# Patient Record
Sex: Female | Born: 1935 | Race: White | Hispanic: No | Smoking: Former smoker
Health system: Southern US, Community
[De-identification: ages and names within clinical notes are randomized; demographics above are authoritative.]

## PROBLEM LIST (undated history)

## (undated) DIAGNOSIS — R630 Anorexia: Secondary | ICD-10-CM

## (undated) DIAGNOSIS — M179 Osteoarthritis of knee, unspecified: Secondary | ICD-10-CM

## (undated) DIAGNOSIS — Z8543 Personal history of malignant neoplasm of ovary: Secondary | ICD-10-CM

## (undated) DIAGNOSIS — R42 Dizziness and giddiness: Secondary | ICD-10-CM

## (undated) DIAGNOSIS — M1710 Unilateral primary osteoarthritis, unspecified knee: Secondary | ICD-10-CM

## (undated) DIAGNOSIS — R002 Palpitations: Secondary | ICD-10-CM

## (undated) DIAGNOSIS — E7800 Pure hypercholesterolemia, unspecified: Secondary | ICD-10-CM

## (undated) DIAGNOSIS — M159 Polyosteoarthritis, unspecified: Secondary | ICD-10-CM

## (undated) DIAGNOSIS — R221 Localized swelling, mass and lump, neck: Secondary | ICD-10-CM

## (undated) DIAGNOSIS — M62830 Muscle spasm of back: Secondary | ICD-10-CM

## (undated) DIAGNOSIS — N390 Urinary tract infection, site not specified: Secondary | ICD-10-CM

## (undated) DIAGNOSIS — R234 Changes in skin texture: Secondary | ICD-10-CM

## (undated) DIAGNOSIS — H9190 Unspecified hearing loss, unspecified ear: Secondary | ICD-10-CM

## (undated) HISTORY — DX: Unilateral primary osteoarthritis, unspecified knee: M17.10

## (undated) HISTORY — DX: Pure hypercholesterolemia, unspecified: E78.00

## (undated) HISTORY — DX: Muscle spasm of back: M62.830

## (undated) HISTORY — DX: Personal history of malignant neoplasm of ovary: Z85.43

## (undated) HISTORY — PX: PTERYGIUM EXCISION: SHX2273

## (undated) HISTORY — DX: Anorexia: R63.0

## (undated) HISTORY — DX: Palpitations: R00.2

## (undated) HISTORY — DX: Dizziness and giddiness: R42

## (undated) HISTORY — DX: Unspecified hearing loss, unspecified ear: H91.90

## (undated) HISTORY — DX: Urinary tract infection, site not specified: N39.0

## (undated) HISTORY — PX: ABDOMINAL HYSTERECTOMY: SHX81

## (undated) HISTORY — DX: Polyosteoarthritis, unspecified: M15.9

## (undated) HISTORY — DX: Localized swelling, mass and lump, neck: R22.1

## (undated) HISTORY — DX: Changes in skin texture: R23.4

## (undated) HISTORY — PX: LIFT / REPAIR BROW PTOSIS FOREHEAD: SUR827

## (undated) HISTORY — DX: Osteoarthritis of knee, unspecified: M17.9

---

## 2015-01-19 LAB — CBC AND DIFFERENTIAL
HCT: 50 % — AB (ref 36–46)
Hemoglobin: 15.8 g/dL (ref 12.0–16.0)
PLATELETS: 771 10*3/uL — AB (ref 150–399)
WBC: 14.6 10^3/mL

## 2015-02-20 LAB — CBC AND DIFFERENTIAL
HEMATOCRIT: 50 % — AB (ref 36–46)
Hemoglobin: 16.1 g/dL — AB (ref 12.0–16.0)
Neutrophils Absolute: 15 /uL
Platelets: 561 10*3/uL — AB (ref 150–399)
WBC: 17.3 10^3/mL

## 2015-02-20 LAB — BASIC METABOLIC PANEL
BUN: 16 mg/dL (ref 4–21)
Creatinine: 0.6 mg/dL (ref <=1.1)
GLUCOSE: 90 mg/dL
POTASSIUM: 5 mmol/L (ref 3.4–5.3)
SODIUM: 140 mmol/L (ref 137–147)

## 2015-02-20 LAB — HEPATIC FUNCTION PANEL
ALT: 16 U/L (ref 7–35)
AST: 24 U/L (ref 13–35)
Alkaline Phosphatase: 92 U/L (ref 25–125)
Bilirubin, Total: 0.4 mg/dL

## 2015-04-20 ENCOUNTER — Encounter: Payer: Self-pay | Admitting: Internal Medicine

## 2015-04-20 ENCOUNTER — Ambulatory Visit (INDEPENDENT_AMBULATORY_CARE_PROVIDER_SITE_OTHER): Payer: Medicare Other | Admitting: Internal Medicine

## 2015-04-20 VITALS — BP 96/62 | HR 70 | Temp 98.0°F | Resp 20 | Ht <= 58 in | Wt 91.6 lb

## 2015-04-20 DIAGNOSIS — G8191 Hemiplegia, unspecified affecting right dominant side: Secondary | ICD-10-CM | POA: Diagnosis not present

## 2015-04-20 DIAGNOSIS — R0902 Hypoxemia: Secondary | ICD-10-CM

## 2015-04-20 DIAGNOSIS — H3530 Unspecified macular degeneration: Secondary | ICD-10-CM | POA: Diagnosis not present

## 2015-04-20 DIAGNOSIS — R413 Other amnesia: Secondary | ICD-10-CM

## 2015-04-20 DIAGNOSIS — M159 Polyosteoarthritis, unspecified: Secondary | ICD-10-CM | POA: Diagnosis not present

## 2015-04-20 NOTE — Progress Notes (Signed)
Patient ID: Stacy Little, female   DOB: [birth year], 81 y.o.   MRN: [id number]    Location:    [facility]   Place of Service:   OFFICE   Advanced Directive information Does patient have an advance directive?: Yes  Chief Complaint  Patient presents with  . Establish Care    [facility]  . OTHER    Daughter in room with patient    HPI:  81 yo female seen today as a new pt. Daughter c/a pt's rapid decline in memory and cognition since October 2016. [place] [facility] [place] and began to have increasing memory issues. No issues with hygiene care and ADLs but she gets confused with names of family members and recognition of faces and places. She has poor short term memory and forgets conversations and misplaces items in home. She no longer drives. She started baby ASA about 1 month ago per recommendation of friend's family who is a neurologist  Macular degeneration OU - followed by retinal specialist Dr Stephannie LiJason Sanders and ophthamologist Dr Cathey EndowBowen. She uses eye gtts  She smoked >50 yrs ago and only for a short time. She quit > 50 yrs ago.   BP typically runs low and has for many years.   Past Medical History  Diagnosis Date  . Hearing loss   . Anorexia   . Dizziness   . Back muscle spasm   . Generalized osteoarthritis of multiple sites   . Hypercholesteremia   . Neck mass   . Osteoarthritis, knee   . History of ovarian cancer   . Palpitations   . Scab   . Urinary tract infection     Past Surgical History  Procedure Laterality Date  . Abdominal hysterectomy      Removal of both ovaries   . Pterygium excision Left   . Lift / repair brow ptosis forehead      Patient Care Team: Kirt BoysMonica Abdelrahman Nair, DO as PCP - General (Internal Medicine)  Social History   Social History  . Marital Status: Widowed    Spouse Name: N/A  . Number of Children: N/A  . Years of Education: N/A   Occupational History  . Not on file.   Social History Main Topics  . Smoking status:  Former Smoker -- 1 years  . Smokeless tobacco: Never Used  . Alcohol Use: 0.6 oz/week    1 Standard drinks or equivalent per week  . Drug Use: Not on file  . Sexual Activity: Not on file   Other Topics Concern  . Not on file   Social History Narrative   Diet:       Do you drink/ eat things with caffeine?  1 cup Coffee am      Marital status: Widowed                              What year were you married ? 1942      Do you live in a house, apartment,assistred living, condo, trailer, etc.)? House ( Daughter )      Is it one or more stories? 2 Stories      How many persons live in your home ? 3      Do you have any pets in your home ?(please list) No      Current or past profession:      Do you exercise?  Type & how often:      Do you have a living will? Yes      Do you have a DNR form?  Yes                     If not, do you want to discuss one?       Do you have signed POA?HPOA forms? Yes                If so, please bring to your        appointment              reports that she has quit smoking. She has never used smokeless tobacco. She reports that she drinks about 0.6 oz of alcohol per week. Her drug history is not on file.  Family History  Problem Relation Age of Onset  . Cancer Father   . Diabetes Sister   . Heart attack Sister    Family Status  Relation Status Death Age  . Father Deceased   . Mother Deceased   . Sister Alive   . Sister Alive   . Daughter Alive   . Daughter Alive   . Daughter Alive     Immunization History  Administered Date(s) Administered  . Influenza-Unspecified 01/27/2013, 01/05/2014, 01/18/2015    Not on File  Medications: Patient's Medications  New Prescriptions   No medications on file  Previous Medications   ASPIRIN 81 MG TABLET    Take 81 mg by mouth daily.   CALCIUM CARBONATE-VIT D-MIN (CALTRATE 600+D PLUS MINERALS PO)    Take by mouth. Take 2 daily   ERYTHROMYCIN OPHTHALMIC OINTMENT     1 application at bedtime.   GLUCOSAMINE-CHONDROITIN (MOVE FREE PO)    Take by mouth.   MULTIPLE VITAMINS-MINERALS (PRESERVISION AREDS 2) CAPS    Take by mouth. Take 2 daily   VITAMIN B-12 (CYANOCOBALAMIN) 1000 MCG TABLET    Take 1,000 mcg by mouth daily.  Modified Medications   No medications on file  Discontinued Medications   No medications on file    Review of Systems  Unable to perform ROS: Dementia    Filed Vitals:   04/20/15 1059  BP: 96/62  Pulse: 70  Temp: 98 F (36.7 C)  TempSrc: Oral  Resp: 20  Height: 4' 9.5" (1.461 m)  Weight: 91 lb 9.6 oz (41.549 kg)  SpO2: 85%   Body mass index is 19.47 kg/(m^2).  Physical Exam  Constitutional: She appears well-developed.  Frail appearing in NAD.  HENT:  Mouth/Throat: Oropharynx is clear and moist. No oropharyngeal exudate.  Eyes: Pupils are equal, round, and reactive to light. No scleral icterus.  Neck: Neck supple. Carotid bruit is not present. No tracheal deviation present. No thyromegaly present.  Cardiovascular: Normal rate, regular rhythm, normal heart sounds and intact distal pulses.  Exam reveals no gallop and no friction rub.   No murmur heard. No LE edema b/l. no calf TTP.   Pulmonary/Chest: Effort normal and breath sounds normal. No stridor. No respiratory distress. She has no wheezes. She has no rales.  Abdominal: Soft. Bowel sounds are normal. She exhibits no distension and no mass. There is no hepatomegaly. There is no tenderness. There is no rebound and no guarding.  Musculoskeletal: She exhibits edema and tenderness.  Lymphadenopathy:    She has no cervical adenopathy.  Neurological: She is alert. She has normal reflexes. She is not disoriented. She displays no atrophy and no tremor.  No cranial nerve deficit (no gross cranial deficits). She exhibits normal muscle tone. Gait abnormal.  LUE 4/5 strength but otherwise intact strength  Skin: Skin is warm and dry. No rash noted.  Psychiatric: She has a normal  mood and affect. Her behavior is normal. Thought content normal.     Labs reviewed: Abstract on 04/13/2015  Component Date Value Ref Range Status  . Hemoglobin 01/19/2015 15.8  12.0 - 16.0 g/dL Final  . HCT 50/12/3816 50* 36 - 46 % Final  . Platelets 01/19/2015 771* 150 - 399 K/L Final  . WBC 01/19/2015 14.6   Final  Abstract on 04/10/2015  Component Date Value Ref Range Status  . Hemoglobinoa  02/20/2015 16.1* 12.0 - 16.0 g/dL Final  . HCT 29/93/7169 50* 36 - 46 % Final  . Neutrophils Absolute 02/20/2015 15   Final  . Platelets 02/20/2015 561* 150 - 399 K/L Final  . WBC 02/20/2015 17.3   Final  . Glucose 02/20/2015 90   Final  . BUN 02/20/2015 16  4 - 21 mg/dL Final  . Creatinine 67/89/3810 0.6  .5 - 1.1 mg/dL Final  . Potassium 17/51/0258 5.0  3.4 - 5.3 mmol/L Final  . Sodium 02/20/2015 140  137 - 147 mmol/L Final  . Alkaline Phosphatase 02/20/2015 92  25 - 125 U/L Final  . ALT 02/20/2015 16  7 - 35 U/L Final  . AST 02/20/2015 24  13 - 35 U/L Final  . Bilirubin, Total 02/20/2015 0.4   Final    No results found.   Assessment/Plan   ICD-9-CM ICD-10-CM   1. Hypoxia - etiology unknown 799.02 R09.02 CT Chest Wo Contrast     CMP  2. Memory loss with probable dementia 780.93 R41.3 CT Head Wo Contrast     CT Chest Wo Contrast     CMP  3. Hemiparesis, right (HCC) r/o stroke 342.90 G81.91 CT Head Wo Contrast     CT Chest Wo Contrast     CMP  4. Generalized OA -- stable 715.00 M15.9   5. Macular degeneration OU 362.50 H35.30    Continue redirection when she gets confused  Continue current medications as ordered  Will call with lab results and imaging studies appts  Follow up in 1 month for results and MMSE  Stacy Little  [facility] [address] [place], [place] [address] [phone] Cell (Monday-Friday 8 AM - 5 PM) [phone] After 5 PM and follow prompts

## 2015-04-20 NOTE — Patient Instructions (Signed)
Continue redirection when she gets confused  Continue current medications as ordered  Will call with lab results and imaging studies appts  Follow up in 1 month for results and MMSE

## 2015-04-21 LAB — COMPREHENSIVE METABOLIC PANEL
A/G RATIO: 1.6 (ref 1.1–2.5)
ALBUMIN: 3.9 g/dL (ref 3.2–4.6)
ALK PHOS: 107 IU/L (ref 39–117)
ALT: 28 IU/L (ref 0–32)
AST: 30 IU/L (ref 0–40)
BILIRUBIN TOTAL: 0.2 mg/dL (ref 0.0–1.2)
BUN / CREAT RATIO: 29 — AB (ref 11–26)
BUN: 19 mg/dL (ref 10–36)
CHLORIDE: 101 mmol/L (ref 96–106)
CO2: 27 mmol/L (ref 18–29)
Calcium: 9.6 mg/dL (ref 8.7–10.3)
Creatinine, Ser: 0.66 mg/dL (ref 0.57–1.00)
GFR calc non Af Amer: 74 mL/min/{1.73_m2} (ref >59)
GFR, EST AFRICAN AMERICAN: 86 mL/min/{1.73_m2} (ref >59)
GLUCOSE: 78 mg/dL (ref 65–99)
Globulin, Total: 2.4 g/dL (ref 1.5–4.5)
POTASSIUM: 4.9 mmol/L (ref 3.5–5.2)
Sodium: 142 mmol/L (ref 134–144)
TOTAL PROTEIN: 6.3 g/dL (ref 6.0–8.5)

## 2015-05-01 ENCOUNTER — Ambulatory Visit
Admission: RE | Admit: 2015-05-01 | Discharge: 2015-05-01 | Disposition: A | Discharge status: Home / Self Care | Payer: Medicare Other | Source: Ambulatory Visit | Attending: Internal Medicine | Admitting: Internal Medicine

## 2015-05-01 DIAGNOSIS — R413 Other amnesia: Secondary | ICD-10-CM

## 2015-05-01 DIAGNOSIS — R0902 Hypoxemia: Secondary | ICD-10-CM

## 2015-05-01 DIAGNOSIS — G8191 Hemiplegia, unspecified affecting right dominant side: Secondary | ICD-10-CM

## 2015-05-11 ENCOUNTER — Telehealth: Payer: Self-pay | Admitting: *Deleted

## 2015-05-11 NOTE — Telephone Encounter (Signed)
Dr. Montez Morita recommends ambulatory pulse ox to determine if she qualifies for [employer] O2. Spoke with Dr. Montez Morita and she want to schedule patient nurse visit to have patient walked for at least 3 minutes and record Pulse ox to see if patient qualifies for O2.  Nurse appointment scheduled for Thursday and daughter, Clydie Braun notified and agreed.

## 2015-05-11 NOTE — Telephone Encounter (Signed)
-----   Message from [place], [place] sent at 05/07/2015  3:15 PM EST ----- She will likely need O2 fir lifetime. No I do not recommend use of wine at this time. Stroke is age indeterminate

## 2015-05-16 ENCOUNTER — Ambulatory Visit: Payer: PRIVATE HEALTH INSURANCE | Admitting: Internal Medicine

## 2015-05-17 ENCOUNTER — Ambulatory Visit (INDEPENDENT_AMBULATORY_CARE_PROVIDER_SITE_OTHER): Payer: Medicare Other | Admitting: Internal Medicine

## 2015-05-17 VITALS — BP 130/66 | HR 82 | Temp 98.2°F | Resp 20

## 2015-05-17 DIAGNOSIS — R0902 Hypoxemia: Secondary | ICD-10-CM

## 2015-05-23 ENCOUNTER — Telehealth: Payer: Self-pay | Admitting: *Deleted

## 2015-05-23 NOTE — Telephone Encounter (Signed)
Paperwork filled out for [employer] [phone] for patient to receive O2. Given to Dr. Montez Morita to review and answer some of the questions and to be faxed to [employer] Fax#: [phone]

## 2015-05-24 ENCOUNTER — Telehealth: Payer: Self-pay

## 2015-05-24 NOTE — Telephone Encounter (Signed)
[employer] sent a fax stating that they were unable to fulfill the request for oxygen delivery that was placed on 05-23-15 due to not being contracted to provide this type of equipment for the patient.   I spoke with patient's daughter, [name], to let her know that [facility] would not be delivering the equipment.   Forms have been placed in Dr. Celene Skeen folder for review.   Please advise.

## 2015-05-24 NOTE — Telephone Encounter (Signed)
noted 

## 2015-05-25 ENCOUNTER — Telehealth: Payer: Self-pay | Admitting: *Deleted

## 2015-05-25 NOTE — Telephone Encounter (Signed)
Changes where made to patient's letter of medical necessity due to [employer] being unable to fulfill our request. Information was faxed to [employer] today for the equipment.

## 2015-05-28 ENCOUNTER — Telehealth: Payer: Self-pay

## 2015-05-28 NOTE — Telephone Encounter (Signed)
Stacy Little was calling to inform Stacy Little/RMA that the form that she sent over on Friday needs more information and a better diagnosis in order for it to be processed. Form will be faxed back to [place] with a notation of the additional information needed and we need to include a more specific diagnosis.

## 2015-05-30 ENCOUNTER — Telehealth: Payer: Self-pay | Admitting: *Deleted

## 2015-05-30 NOTE — Telephone Encounter (Signed)
Larita Fife with [facility] called and stated that they need a qualifying diagnosis for O2. Hypoxia is not covered. Some of the codes that are covered are: Lung Cancer C34.90, Cystic Fibrosis E84.9, pulmonary Hypertension I27.0, Congestive Heart Failure I50.9, Obstruct Chronic Bronchitis J44.9, Emphysema J43.9, Bronchiectasis J47.9, COPD J44.9, Asbestos J61, Pneumoconiosis J63.06, Unspecified Pneumoconiosis J64, Hypostatis Pneumonia J18.2, Chronic Rest Failure J96.10, Acute Resp Failure J96.20 Please Advise.

## 2015-06-01 ENCOUNTER — Ambulatory Visit: Payer: Medicare Other | Admitting: Internal Medicine

## 2015-06-01 NOTE — Telephone Encounter (Signed)
Patient daughter called today and stated that patient had an appointment for today but she is too sick to bring her. Daughter does not know if patient will go for this or not due to her memory. But daughter wants to try and will call [facility] to come out.   We need a diagnosis to send in to [facility]. (see message below). Please Advise.

## 2015-06-05 ENCOUNTER — Telehealth: Payer: Self-pay | Admitting: *Deleted

## 2015-06-05 NOTE — Telephone Encounter (Signed)
Clydie Braun, daughter called and stated that they took patient to [facility] urgent care yesterday due to Hallucinating. Urgent Care Diagnosed with UTI. They gave her Levofloxcin one daily, Ciprofloxin and Benzonate cough medication. Just wanted to let us know. Rescheduled the  appointment she missed due to sickness.

## 2015-06-06 ENCOUNTER — Ambulatory Visit (INDEPENDENT_AMBULATORY_CARE_PROVIDER_SITE_OTHER): Payer: Medicare Other | Admitting: Internal Medicine

## 2015-06-06 ENCOUNTER — Encounter: Payer: Self-pay | Admitting: Internal Medicine

## 2015-06-06 VITALS — BP 120/70 | HR 97 | Temp 97.9°F | Resp 20 | Ht <= 58 in | Wt 92.4 lb

## 2015-06-06 DIAGNOSIS — R0902 Hypoxemia: Secondary | ICD-10-CM | POA: Diagnosis not present

## 2015-06-06 DIAGNOSIS — R5381 Other malaise: Secondary | ICD-10-CM | POA: Diagnosis not present

## 2015-06-06 DIAGNOSIS — J439 Emphysema, unspecified: Secondary | ICD-10-CM | POA: Diagnosis not present

## 2015-06-06 DIAGNOSIS — R5382 Chronic fatigue, unspecified: Secondary | ICD-10-CM

## 2015-06-06 DIAGNOSIS — R5383 Other fatigue: Secondary | ICD-10-CM | POA: Insufficient documentation

## 2015-06-06 DIAGNOSIS — R443 Hallucinations, unspecified: Secondary | ICD-10-CM | POA: Diagnosis not present

## 2015-06-06 MED ORDER — PREDNISONE 20 MG PO TABS
ORAL_TABLET | ORAL | Status: DC
Start: 2015-06-06 — End: 2015-07-02

## 2015-06-06 MED ORDER — FLUTICASONE FUROATE-VILANTEROL 100-25 MCG/INH IN AEPB: INHALATION_SPRAY | RESPIRATORY_TRACT | Status: AC

## 2015-06-06 NOTE — Progress Notes (Signed)
Patient ID: Stacy Little, female   DOB: 1915/05/18, 82 y.o.   MRN: [id number]    Facility  [facility]    Place of Service:   OFFICE    Not on File  Chief Complaint  Patient presents with  . Hospitalization Follow-up    chest cold, hallucinations, low  O2 saturations,     HPI:  Patient seen acutely today for problems of failure to thrive associated with hallucinations. Patient was doing reasonably well at home until about a week ago. She seemed to start with a chest cold that time. She lost her appetite. She has been coughing. Over the last few days she's been having hallucinations. Oxygen saturations appear to be low. Patient has become agitated and confused. She was lost her balance and is no longer walking safely in the house, which she used to be able to do.  Patient was seen 06/04/2015 at [facility] urgent care about [facility]. She was diagnosed as having a urinary tract infection. She was given levofloxacin and ciprofloxacin. She was also given benzonatate for cough medicine. She is not improved since starting these medicines.  Chest x-ray was done 05/01/2015. And shows scattered opacities in both lungs. There was a suggestion of interstitial lung disease.  CT of the head was done 05/01/2015. 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 There were subcortical white matter and periventricular small vessel ischemic changes and intracranial atherosclerosis.  Medications: Patient's Medications  New Prescriptions   No medications on file  Previous Medications   ASPIRIN 81 MG TABLET    Take 81 mg by mouth daily.   BENZONATATE (TESSALON) 100 MG CAPSULE    Take 100 mg by mouth 3 (three) times daily as needed for cough.   CALCIUM CARBONATE-VIT D-MIN (CALTRATE 600+D PLUS MINERALS PO)    Take by mouth. Take 2 daily   CIPROFLOXACIN (CIPRO) 250 MG TABLET    Take 250 mg by mouth 2 (two) times daily.   ERYTHROMYCIN  OPHTHALMIC OINTMENT    1 application at bedtime.   GLUCOSAMINE-CHONDROITIN (MOVE FREE PO)    Take by mouth.   LEVOFLOXACIN (LEVAQUIN) 750 MG TABLET    Take 750 mg by mouth daily.   MULTIPLE VITAMINS-MINERALS (PRESERVISION AREDS 2) CAPS    Take by mouth. Take 2 daily   VITAMIN B-12 (CYANOCOBALAMIN) 1000 MCG TABLET    Take 1,000 mcg by mouth daily.  Modified Medications   No medications on file  Discontinued Medications   No medications on file    Review of Systems  Constitutional: Negative for fever, chills, diaphoresis, activity change, appetite change and fatigue. Unexpected weight change: loss of appetite.  HENT: Negative for congestion, ear discharge, ear pain, hearing loss, postnasal drip, rhinorrhea, sore throat, tinnitus, trouble swallowing and voice change.   Eyes: Negative for pain, redness, itching and visual disturbance.  Respiratory: Positive for cough. Negative for choking, shortness of breath and wheezing.   Cardiovascular: Negative for chest pain, palpitations and leg swelling.  Gastrointestinal: Negative for nausea, abdominal pain, diarrhea, constipation and abdominal distention.  Endocrine: Negative for cold intolerance, heat intolerance, polydipsia, polyphagia and polyuria.  Genitourinary: Negative for dysuria, urgency, frequency, hematuria, flank pain, vaginal discharge, difficulty urinating and pelvic pain.       Incontinent  Musculoskeletal: Positive for gait problem. Negative for myalgias, back pain, arthralgias, neck pain and neck stiffness.  Skin: Negative for color change, pallor and rash.  Allergic/Immunologic: Negative.   Neurological:  Positive for weakness. Negative for dizziness, tremors, seizures, syncope, numbness and headaches.  Hematological: Negative for adenopathy. Does not bruise/bleed easily.  Psychiatric/Behavioral: Positive for hallucinations, behavioral problems, confusion, sleep disturbance and agitation. Negative for suicidal ideas and dysphoric  mood. The patient is nervous/anxious. The patient is not hyperactive.     Filed Vitals:   06/06/15 1538  BP: 120/70  Pulse: 97  Temp: 97.9 F (36.6 C)  TempSrc: Oral  Resp: 20  Height: _0  (1.448 m)  Weight: 92 lb 6.4 oz (41.912 kg)  SpO2: 79%   Body mass index is 19.99 kg/(m^2). Filed Weights   06/06/15 1538  Weight: 92 lb 6.4 oz (41.912 kg)     Physical Exam  Constitutional:  Frail, thin, elderly female  HENT:  Right Ear: External ear normal.  Left Ear: External ear normal.  Nose: Nose normal.  Mouth/Throat: Oropharynx is clear and moist. No oropharyngeal exudate.  Partial hearing loss bilaterally  Eyes: Conjunctivae and EOM are normal. Pupils are equal, round, and reactive to light. No scleral icterus.  Neck: No JVD present. No tracheal deviation present. No thyromegaly present.  Cardiovascular: Normal rate, regular rhythm, normal heart sounds and intact distal pulses.  Exam reveals no gallop and no friction rub.   No murmur heard. Pulmonary/Chest: Effort normal. No respiratory distress. She has no wheezes. She has rales. She exhibits no tenderness.  Tachypnea  Abdominal: She exhibits no distension and no mass. There is no tenderness.  Musculoskeletal: Normal range of motion. She exhibits no edema or tenderness.  Lymphadenopathy:    She has no cervical adenopathy.  Neurological: She is alert. No cranial nerve deficit. Coordination abnormal.  Disoriented. Unable to stand unassisted. Poor balance.  Skin: No rash noted. She is not diaphoretic. No erythema. No pallor.  Psychiatric: She has a normal mood and affect. Her behavior is normal. Judgment and thought content normal.    Labs reviewed: Lab Summary Latest Ref Rng 04/20/2015 02/20/2015 01/19/2015  Hemoglobin 12.0 - 16.0 g/dL (None) 16.1(A) 15.8  Hematocrit 36 - 46 % (None) 50(A) 50(A)  White count - (None) 17.3 14.6  Platelet count 150 - 399 K/L (None) 561(A) 771(A)  Sodium 134 - 144 mmol/L 142 140 (None)    Potassium 3.5 - 5.2 mmol/L 4.9 5.0 (None)  Calcium 8.7 - 10.3 mg/dL 9.6 (None) (None)  Phosphorus - (None) (None) (None)  Creatinine 0.57 - 1.00 mg/dL 0.66 0.6 (None)  AST 0 - 40 IU/L 30 24 (None)  Alk Phos 39 - 117 IU/L 107 92 (None)  Bilirubin 0.0 - 1.2 mg/dL 0.2 (None) (None)  Glucose 65 - 99 mg/dL 78 90 (None)  Cholesterol - (None) (None) (None)  HDL cholesterol - (None) (None) (None)  Triglycerides - (None) (None) (None)  LDL Direct - (None) (None) (None)  LDL Calc - (None) (None) (None)  Total protein - (None) (None) (None)  Albumin 3.2 - 4.6 g/dL 3.9 (None) (None)   No results found for: TSH, T3TOTAL, T4TOTAL, THYROIDAB Lab Results  Component Value Date   BUN 19 04/20/2015   BUN 16 02/20/2015   No results found for: HGBA1C  Assessment/Plan  1. Hallucination  - CMP - CBC With Differential  2. Pulmonary emphysema, unspecified emphysema type ([place]) - predniSONE (DELTASONE) 20 MG tablet; One each morning to help breathing  Dispense: 30 tablet; Refill: 2  3. Chronic fatigue - TSH  4. Debility - TSH  5. Hypoxia -O2 at 2L/Min via nasal prongs.

## 2015-06-06 NOTE — Telephone Encounter (Signed)
Addressed by Dr Chilton Si at appt today

## 2015-06-07 LAB — COMPREHENSIVE METABOLIC PANEL
A/G RATIO: 1.4 (ref 1.1–2.5)
ALBUMIN: 3.4 g/dL (ref 3.2–4.6)
ALT: 49 IU/L — ABNORMAL HIGH (ref 0–32)
AST: 51 IU/L — ABNORMAL HIGH (ref 0–40)
Alkaline Phosphatase: 114 IU/L (ref 39–117)
BILIRUBIN TOTAL: 0.7 mg/dL (ref 0.0–1.2)
BUN / CREAT RATIO: 35 — AB (ref 11–26)
BUN: 24 mg/dL (ref 10–36)
CHLORIDE: 95 mmol/L — AB (ref 96–106)
CO2: 26 mmol/L (ref 18–29)
Calcium: 10.5 mg/dL — ABNORMAL HIGH (ref 8.7–10.3)
Creatinine, Ser: 0.68 mg/dL (ref 0.57–1.00)
GFR calc non Af Amer: 73 mL/min/{1.73_m2} (ref >59)
GFR, EST AFRICAN AMERICAN: 84 mL/min/{1.73_m2} (ref >59)
GLOBULIN, TOTAL: 2.4 g/dL (ref 1.5–4.5)
Glucose: 119 mg/dL — ABNORMAL HIGH (ref 65–99)
POTASSIUM: 4.6 mmol/L (ref 3.5–5.2)
SODIUM: 140 mmol/L (ref 134–144)
TOTAL PROTEIN: 5.8 g/dL — AB (ref 6.0–8.5)

## 2015-06-07 LAB — CBC WITH DIFFERENTIAL
Basophils Absolute: 0.1 10*3/uL (ref 0.0–0.2)
Basos: 1 %
EOS (ABSOLUTE): 0.2 10*3/uL (ref 0.0–0.4)
EOS: 1 %
HEMOGLOBIN: 16.6 g/dL — AB (ref 11.1–15.9)
Hematocrit: 48.1 % — ABNORMAL HIGH (ref 34.0–46.6)
IMMATURE GRANULOCYTES: 2 %
Immature Grans (Abs): 0.5 10*3/uL — ABNORMAL HIGH (ref 0.0–0.1)
LYMPHS ABS: 1.3 10*3/uL (ref 0.7–3.1)
Lymphs: 5 %
MCH: 29 pg (ref 26.6–33.0)
MCHC: 34.5 g/dL (ref 31.5–35.7)
MCV: 84 fL (ref 79–97)
MONOS ABS: 0.6 10*3/uL (ref 0.1–0.9)
Monocytes: 2 %
Neutrophils Absolute: 24.8 10*3/uL — ABNORMAL HIGH (ref 1.4–7.0)
Neutrophils: 89 %
RBC: 5.72 x10E6/uL — ABNORMAL HIGH (ref 3.77–5.28)
RDW: 16.6 % — ABNORMAL HIGH (ref 12.3–15.4)
WBC: 27.4 10*3/uL (ref 3.4–10.8)

## 2015-06-07 LAB — TSH: TSH: 1.42 u[IU]/mL (ref 0.450–4.500)

## 2015-06-12 ENCOUNTER — Telehealth: Payer: Self-pay | Admitting: *Deleted

## 2015-06-12 NOTE — Telephone Encounter (Signed)
Stacy Little with [facility] [phone] dropped off Oxygen Form to be signed by Dr. Council Mechanic today. She will be back this afternoon to pick up. Printed OV note and Face Sheet and attached. Placed in Dr. Earl Many to review and sign.

## 2015-06-13 ENCOUNTER — Ambulatory Visit (INDEPENDENT_AMBULATORY_CARE_PROVIDER_SITE_OTHER): Payer: Medicare Other | Admitting: Internal Medicine

## 2015-06-13 ENCOUNTER — Encounter: Payer: Self-pay | Admitting: Internal Medicine

## 2015-06-13 VITALS — BP 118/50 | HR 81 | Temp 97.6°F | Resp 20 | Ht <= 58 in | Wt 88.6 lb

## 2015-06-13 DIAGNOSIS — N3000 Acute cystitis without hematuria: Secondary | ICD-10-CM

## 2015-06-13 DIAGNOSIS — D729 Disorder of white blood cells, unspecified: Secondary | ICD-10-CM | POA: Diagnosis not present

## 2015-06-13 DIAGNOSIS — R748 Abnormal levels of other serum enzymes: Secondary | ICD-10-CM

## 2015-06-13 DIAGNOSIS — D72828 Other elevated white blood cell count: Secondary | ICD-10-CM | POA: Insufficient documentation

## 2015-06-13 DIAGNOSIS — F411 Generalized anxiety disorder: Secondary | ICD-10-CM | POA: Insufficient documentation

## 2015-06-13 DIAGNOSIS — R5382 Chronic fatigue, unspecified: Secondary | ICD-10-CM

## 2015-06-13 DIAGNOSIS — G4700 Insomnia, unspecified: Secondary | ICD-10-CM

## 2015-06-13 DIAGNOSIS — R630 Anorexia: Secondary | ICD-10-CM

## 2015-06-13 DIAGNOSIS — N390 Urinary tract infection, site not specified: Secondary | ICD-10-CM | POA: Insufficient documentation

## 2015-06-13 DIAGNOSIS — R5381 Other malaise: Secondary | ICD-10-CM | POA: Diagnosis not present

## 2015-06-13 DIAGNOSIS — R0902 Hypoxemia: Secondary | ICD-10-CM

## 2015-06-13 DIAGNOSIS — R443 Hallucinations, unspecified: Secondary | ICD-10-CM | POA: Diagnosis not present

## 2015-06-13 MED ORDER — ALPRAZOLAM 0.5 MG PO TBDP: 0.5000 mg | ORAL_TABLET | Freq: Two times a day (BID) | ORAL | Status: DC | PRN

## 2015-06-13 NOTE — Telephone Encounter (Addendum)
Paperwork left up front for pick up. Louretta Shorten with [employer] [phone] Notified that it is ready for pick up. Copy made for chart. And copy given to patient at appointment.

## 2015-06-13 NOTE — Progress Notes (Signed)
Patient ID: Stacy Little, female   DOB: 21-Mar-1916, 82 y.o.   MRN: [id number]    Facility  [facility]    Place of Service:   OFFICE    Not on File  Chief Complaint  Patient presents with  . Medical Management of Chronic Issues    1 week f/u-O2, sleeping alot, weak,    HPI:  Patient remains quite weak. Cough is better from her respiratory illness. Patient was finally started on oxygen 6 days ago. The oxygen falls off at night. Her daughter worries a lot about this.  Patient is more conversant and alert today than she was during her last visit. Her daughter says she is sleeping a lot during the day. She is also sleeping better at night. There are no recent problems with hallucinations.  Lab work following the last visit was basically okay except for a modest elevation in her glucose 219, elevated WBCs at 27,400, and a slight elevation in 2 liver enzymes. I suspect this was related to her acute illness. We will repeat these tests today.  Daughter is concerned about patient's current anorectic state. Her weight is reduced 4 pounds from when she was seen a week ago. She is drinking fluids. She likes then sleeps. She doesn't like to chew things much. She denies dysphagia or odynophagia.  Medications: Patient's Medications  New Prescriptions   No medications on file  Previous Medications   ASPIRIN 81 MG TABLET    Take 81 mg by mouth daily.   BENZONATATE (TESSALON) 100 MG CAPSULE    Take 100 mg by mouth 3 (three) times daily as needed for cough.   CALCIUM CARBONATE-VIT D-MIN (CALTRATE 600+D PLUS MINERALS PO)    Take by mouth. Take 2 daily   CIPROFLOXACIN (CIPRO) 250 MG TABLET    Take 250 mg by mouth 2 (two) times daily.   ERYTHROMYCIN OPHTHALMIC OINTMENT    1 application at bedtime.   FLUTICASONE FUROATE-VILANTEROL (BREO ELLIPTA) 100-25 MCG/INH AEPB    Inhale into the lungs once daily   GLUCOSAMINE-CHONDROITIN (MOVE FREE PO)    Take by mouth.   LEVOFLOXACIN (LEVAQUIN) 750 MG TABLET    Take  750 mg by mouth daily.   MULTIPLE VITAMINS-MINERALS (PRESERVISION AREDS 2) CAPS    Take by mouth. Take 2 daily   PREDNISONE (DELTASONE) 20 MG TABLET    One each morning to help breathing   VITAMIN B-12 (CYANOCOBALAMIN) 1000 MCG TABLET    Take 1,000 mcg by mouth daily.  Modified Medications   No medications on file  Discontinued Medications   No medications on file    Review of Systems  Constitutional: Positive for appetite change and unexpected weight change (loss of appetite). Negative for fever, chills, diaphoresis, activity change and fatigue.  HENT: Negative for congestion, ear discharge, ear pain, hearing loss, postnasal drip, rhinorrhea, sore throat, tinnitus, trouble swallowing and voice change.   Eyes: Negative for pain, redness, itching and visual disturbance.  Respiratory: Positive for cough. Negative for choking, shortness of breath and wheezing.   Cardiovascular: Negative for chest pain, palpitations and leg swelling.  Gastrointestinal: Negative for nausea, abdominal pain, diarrhea, constipation and abdominal distention.  Endocrine: Negative for cold intolerance, heat intolerance, polydipsia, polyphagia and polyuria.  Genitourinary: Negative for dysuria, urgency, frequency, hematuria, flank pain, vaginal discharge, difficulty urinating and pelvic pain.       Incontinent  Musculoskeletal: Positive for gait problem. Negative for myalgias, back pain, arthralgias, neck pain and neck stiffness.  Skin: Negative for color  change, pallor and rash.  Allergic/Immunologic: Negative.   Neurological: Positive for weakness. Negative for dizziness, tremors, seizures, syncope, numbness and headaches.  Hematological: Negative for adenopathy. Does not bruise/bleed easily.  Psychiatric/Behavioral: Positive for hallucinations, behavioral problems, confusion, sleep disturbance and agitation. Negative for suicidal ideas and dysphoric mood. The patient is nervous/anxious. The patient is not  hyperactive.     Filed Vitals:   06/13/15 1404  BP: 118/50  Pulse: 81  Temp: 97.6 F (36.4 C)  TempSrc: Oral  Resp: 20  Height: _0  (1.448 m)  Weight: 88 lb 9.6 oz (40.189 kg)  SpO2: 96%   Body mass index is 19.17 kg/(m^2). Filed Weights   06/13/15 1404  Weight: 88 lb 9.6 oz (40.189 kg)     Physical Exam  Constitutional:  Frail, thin, elderly female  HENT:  Right Ear: External ear normal.  Left Ear: External ear normal.  Nose: Nose normal.  Mouth/Throat: Oropharynx is clear and moist. No oropharyngeal exudate.  Partial hearing loss bilaterally  Eyes: Conjunctivae and EOM are normal. Pupils are equal, round, and reactive to light. No scleral icterus.  Neck: No JVD present. No tracheal deviation present. No thyromegaly present.  Cardiovascular: Normal rate, regular rhythm, normal heart sounds and intact distal pulses.  Exam reveals no gallop and no friction rub.   No murmur heard. Pulmonary/Chest: Effort normal. No respiratory distress. She has no wheezes. She has rales. She exhibits no tenderness.  Tachypnea  Abdominal: She exhibits no distension and no mass. There is no tenderness.  Musculoskeletal: Normal range of motion. She exhibits no edema or tenderness.  Lymphadenopathy:    She has no cervical adenopathy.  Neurological: She is alert. No cranial nerve deficit. Coordination abnormal.  Disoriented. Unable to stand unassisted. Poor balance.  Skin: No rash noted. She is not diaphoretic. No erythema. No pallor.  Psychiatric: She has a normal mood and affect. Her behavior is normal. Judgment and thought content normal.    Labs reviewed: Lab Summary Latest Ref Rng 06/06/2015 04/20/2015 02/20/2015 01/19/2015  Hemoglobin 11.1 - 15.9 g/dL 16.6(H) (None) 16.1(A) 15.8  Hematocrit 34.0 - 46.6 % 48.1(H) (None) 50(A) 50(A)  White count 3.4 - 10.8 x10E3/uL 27.4(HH) (None) 17.3 14.6  Platelet count 150 - 399 K/L (None) (None) 561(A) 771(A)  Sodium 134 - 144 mmol/L 140 142 140  (None)  Potassium 3.5 - 5.2 mmol/L 4.6 4.9 5.0 (None)  Calcium 8.7 - 10.3 mg/dL 10.5(H) 9.6 (None) (None)  Phosphorus - (None) (None) (None) (None)  Creatinine 0.57 - 1.00 mg/dL 0.68 0.66 0.6 (None)  AST 0 - 40 IU/L 51(H) 30 24 (None)  Alk Phos 39 - 117 IU/L 114 107 92 (None)  Bilirubin 0.0 - 1.2 mg/dL 0.7 0.2 (None) (None)  Glucose 65 - 99 mg/dL 119(H) 78 90 (None)  Cholesterol - (None) (None) (None) (None)  HDL cholesterol - (None) (None) (None) (None)  Triglycerides - (None) (None) (None) (None)  LDL Direct - (None) (None) (None) (None)  LDL Calc - (None) (None) (None) (None)  Total protein - (None) (None) (None) (None)  Albumin 3.2 - 4.6 g/dL 3.4 3.9 (None) (None)   Lab Results  Component Value Date   TSH 1.420 06/06/2015   Lab Results  Component Value Date   BUN 24 06/06/2015   BUN 19 04/20/2015   BUN 16 02/20/2015   No results found for: HGBA1C   Ct Head Wo Contrast  05/01/2015  CLINICAL DATA:  Memory loss, confusion, remote history of ovarian cancer EXAM: CT  HEAD WITHOUT CONTRAST TECHNIQUE: Contiguous axial images were obtained from the base of the skull through the vertex without intravenous contrast. COMPARISON:  None. FINDINGS: No evidence of parenchymal hemorrhage or extra-axial fluid collection. No mass lesion, mass effect, or midline shift. No CT evidence of acute infarction. Encephalomalacic changes related to old left PCA distribution infarct in the medial left occipital lobe. Mild subcortical white matter and periventricular small vessel ischemic changes. Intracranial atherosclerosis. Cerebral volume is within normal limits.  No ventriculomegaly. Partial opacification of the right ethmoid sinuses. Visualized paranasal sinuses and mastoid air cells otherwise clear. No evidence of calvarial fracture. IMPRESSION: No evidence of acute intracranial abnormality. 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 Mild small vessel ischemic changes. Electronically Signed   By: Julian Hy  M.D.   On: 05/01/2015 12:07   Ct Chest Wo Contrast  05/01/2015  CLINICAL DATA:  Hypoxia. Remote history of ovarian cancer, status post TAH/ BSO, status post oral chemotherapy. EXAM: CT CHEST WITHOUT CONTRAST TECHNIQUE: Multidetector CT imaging of the chest was performed following the standard protocol without IV contrast. COMPARISON:  None. FINDINGS: Mediastinum/Nodes: Heart is normal in size. No pericardial effusion. Three vessel coronary atherosclerosis. Atherosclerotic calcifications of the aortic arch. Mild ectasia of the ascending thoracic aorta, measuring 3.2 cm. Small mediastinal lymph nodes which do not meet pathologic CT size criteria. Visualized thyroid is unremarkable. Lungs/Pleura: Scattered subpleural patchy opacities, for example in the medial right upper lobe (series 4/image 12) and the lateral right lung base (series 4/ image 42), with associated bronchiectasis, favored to reflect scarring related to prior infection. Central ground-glass opacity/mosaic attenuation, bilateral lower lobe predominant (series 4/ image 32), suggesting mild interstitial lung disease, nonspecific. 4 mm subpleural nodule in the right lower lobe (series 4/ image 31). Additional tiny nodules bilaterally measuring up to 3-4 mm. Suspected mild centrilobular emphysematous changes. No pleural effusion or pneumothorax. Upper abdomen: Visualized upper abdomen is notable for vascular calcifications. Musculoskeletal: Degenerative changes of the visualized thoracolumbar spine. IMPRESSION: Subpleural patchy opacities in the right lung, favored to reflect scarring related to prior infection. Central ground-glass opacity/mosaic attenuation, lower lobe predominant, suggesting mild interstitial lung disease, possibly chronic. While nonspecific, NSIP/drug toxicity related to prior chemotherapy is possible. Scattered small bilateral pulmonary nodules measuring up to 4 mm in the right lower lobe, nonspecific. Given the remote history of the  patient's cancer, this is considered unrelated. As such, assuming patient is high risk for primary bronchogenic neoplasm, a single follow-up CT chest is suggested in 1 year. This recommendation follows the consensus statement: Guidelines for Management of Small Pulmonary Nodules Detected on CT Scans: A Statement from the [place] as published in Radiology 2005; 237:395-400. Electronically Signed   By: Julian Hy M.D.   On: 05/01/2015 12:41    Assessment/Plan  1. Neutrophilia   - CBC With Differential  2. Hypoxia  continue oxygen  3. Hallucination  Improved   4. Chronic fatigue - CMP  5. Debility - CMP  6. Abnormal liver enzymes - CMP  7. Anxiety state - ALPRAZolam (NIRAVAM) 0.5 MG dissolvable tablet; Take 1 tablet (0.5 mg total) by mouth 2 (two) times daily as needed for anxiety.  Dispense: 60 tablet; Refill: 0  8. Acute cystitis without hematuria - Urine culture - Urinalysis  9. Anorexia Discussed different ways of improving caloric intake for about 20 minutes.   10. Insomnia Use Xanax if needed

## 2015-06-14 LAB — CBC WITH DIFFERENTIAL
BASOS ABS: 0 10*3/uL (ref 0.0–0.2)
Basos: 0 %
EOS (ABSOLUTE): 0.1 10*3/uL (ref 0.0–0.4)
Eos: 0 %
Hematocrit: 49.6 % — ABNORMAL HIGH (ref 34.0–46.6)
Hemoglobin: 15.9 g/dL (ref 11.1–15.9)
IMMATURE GRANS (ABS): 0.5 10*3/uL — AB (ref 0.0–0.1)
Immature Granulocytes: 1 %
LYMPHS: 1 %
Lymphocytes Absolute: 0.5 10*3/uL — ABNORMAL LOW (ref 0.7–3.1)
MCH: 27.9 pg (ref 26.6–33.0)
MCHC: 32.1 g/dL (ref 31.5–35.7)
MCV: 87 fL (ref 79–97)
Monocytes Absolute: 0.3 10*3/uL (ref 0.1–0.9)
Monocytes: 1 %
NEUTROS ABS: 32.8 10*3/uL — AB (ref 1.4–7.0)
NEUTROS PCT: 97 %
RBC: 5.69 x10E6/uL — AB (ref 3.77–5.28)
RDW: 16.9 % — ABNORMAL HIGH (ref 12.3–15.4)
WBC: 34.1 10*3/uL — AB (ref 3.4–10.8)

## 2015-06-14 LAB — URINALYSIS
Bilirubin, UA: NEGATIVE
GLUCOSE, UA: NEGATIVE
KETONES UA: NEGATIVE
LEUKOCYTES UA: NEGATIVE
Nitrite, UA: NEGATIVE
Protein, UA: NEGATIVE
RBC, UA: NEGATIVE
SPEC GRAV UA: 1.013 (ref 1.005–1.030)
UUROB: 1 mg/dL (ref 0.2–1.0)
pH, UA: 7.5 (ref 5.0–7.5)

## 2015-06-14 LAB — COMPREHENSIVE METABOLIC PANEL
A/G RATIO: 1.2 (ref 1.1–2.5)
ALK PHOS: 94 IU/L (ref 39–117)
ALT: 26 IU/L (ref 0–32)
AST: 30 IU/L (ref 0–40)
Albumin: 3.1 g/dL — ABNORMAL LOW (ref 3.2–4.6)
BUN/Creatinine Ratio: 26 (ref 11–26)
BUN: 16 mg/dL (ref 10–36)
Bilirubin Total: 0.3 mg/dL (ref 0.0–1.2)
CHLORIDE: 95 mmol/L — AB (ref 96–106)
CO2: 29 mmol/L (ref 18–29)
Calcium: 8.8 mg/dL (ref 8.7–10.3)
Creatinine, Ser: 0.62 mg/dL (ref 0.57–1.00)
GFR calc Af Amer: 87 mL/min/{1.73_m2} (ref >59)
GFR calc non Af Amer: 75 mL/min/{1.73_m2} (ref >59)
GLOBULIN, TOTAL: 2.6 g/dL (ref 1.5–4.5)
Glucose: 258 mg/dL — ABNORMAL HIGH (ref 65–99)
POTASSIUM: 5 mmol/L (ref 3.5–5.2)
SODIUM: 141 mmol/L (ref 134–144)
Total Protein: 5.7 g/dL — ABNORMAL LOW (ref 6.0–8.5)

## 2015-06-15 ENCOUNTER — Ambulatory Visit: Payer: Self-pay | Admitting: Internal Medicine

## 2015-06-15 LAB — URINE CULTURE: Organism ID, Bacteria: NO GROWTH

## 2015-06-19 ENCOUNTER — Telehealth: Payer: Self-pay | Admitting: *Deleted

## 2015-06-19 NOTE — Telephone Encounter (Signed)
Patient daughter, Clydie Braun called wanting results of labwork and Urine Culture done by Dr. Chilton Si on 06/13/2015.  Please Advise.

## 2015-06-19 NOTE — Telephone Encounter (Signed)
Looks like results are negative

## 2015-06-19 NOTE — Telephone Encounter (Signed)
Called and left message with Caregiver for Stacy Little to return call.

## 2015-06-21 ENCOUNTER — Telehealth: Payer: Self-pay | Admitting: *Deleted

## 2015-06-21 NOTE — Telephone Encounter (Signed)
Received [facility] Form For O2 Certificate of Medical Necessity filled out and given to Dr. Chilton Si to review and sign. OV notes attached.

## 2015-06-21 NOTE — Telephone Encounter (Signed)
LMOM to return call.

## 2015-06-27 ENCOUNTER — Emergency Department (HOSPITAL_COMMUNITY): Payer: Medicare Other

## 2015-06-27 ENCOUNTER — Inpatient Hospital Stay (HOSPITAL_COMMUNITY)
Admission: EM | Admit: 2015-06-27 | Discharge: 2015-07-02 | DRG: 480 | Disposition: A | Discharge status: Skilled Nursing Facility | Payer: Medicare Other | Attending: Internal Medicine | Admitting: Internal Medicine

## 2015-06-27 ENCOUNTER — Encounter (HOSPITAL_COMMUNITY): Payer: Self-pay | Admitting: Emergency Medicine

## 2015-06-27 DIAGNOSIS — M810 Age-related osteoporosis without current pathological fracture: Secondary | ICD-10-CM | POA: Diagnosis present

## 2015-06-27 DIAGNOSIS — R5381 Other malaise: Secondary | ICD-10-CM | POA: Diagnosis not present

## 2015-06-27 DIAGNOSIS — S72002A Fracture of unspecified part of neck of left femur, initial encounter for closed fracture: Secondary | ICD-10-CM | POA: Diagnosis present

## 2015-06-27 DIAGNOSIS — W19XXXA Unspecified fall, initial encounter: Secondary | ICD-10-CM

## 2015-06-27 DIAGNOSIS — W1830XA Fall on same level, unspecified, initial encounter: Secondary | ICD-10-CM | POA: Diagnosis present

## 2015-06-27 DIAGNOSIS — S72142A Displaced intertrochanteric fracture of left femur, initial encounter for closed fracture: Secondary | ICD-10-CM | POA: Diagnosis present

## 2015-06-27 DIAGNOSIS — J439 Emphysema, unspecified: Secondary | ICD-10-CM | POA: Diagnosis present

## 2015-06-27 DIAGNOSIS — E7800 Pure hypercholesterolemia, unspecified: Secondary | ICD-10-CM | POA: Diagnosis present

## 2015-06-27 DIAGNOSIS — G4700 Insomnia, unspecified: Secondary | ICD-10-CM | POA: Diagnosis present

## 2015-06-27 DIAGNOSIS — Z7982 Long term (current) use of aspirin: Secondary | ICD-10-CM | POA: Diagnosis not present

## 2015-06-27 DIAGNOSIS — Z8543 Personal history of malignant neoplasm of ovary: Secondary | ICD-10-CM | POA: Diagnosis not present

## 2015-06-27 DIAGNOSIS — T148XXA Other injury of unspecified body region, initial encounter: Secondary | ICD-10-CM

## 2015-06-27 DIAGNOSIS — R410 Disorientation, unspecified: Secondary | ICD-10-CM | POA: Diagnosis present

## 2015-06-27 DIAGNOSIS — Z8673 Personal history of transient ischemic attack (TIA), and cerebral infarction without residual deficits: Secondary | ICD-10-CM

## 2015-06-27 DIAGNOSIS — I471 Supraventricular tachycardia: Secondary | ICD-10-CM | POA: Diagnosis not present

## 2015-06-27 DIAGNOSIS — H9190 Unspecified hearing loss, unspecified ear: Secondary | ICD-10-CM | POA: Diagnosis present

## 2015-06-27 DIAGNOSIS — F419 Anxiety disorder, unspecified: Secondary | ICD-10-CM | POA: Diagnosis present

## 2015-06-27 DIAGNOSIS — E876 Hypokalemia: Secondary | ICD-10-CM | POA: Diagnosis not present

## 2015-06-27 DIAGNOSIS — Y92007 Garden or yard of unspecified non-institutional (private) residence as the place of occurrence of the external cause: Secondary | ICD-10-CM | POA: Diagnosis not present

## 2015-06-27 DIAGNOSIS — F411 Generalized anxiety disorder: Secondary | ICD-10-CM | POA: Diagnosis not present

## 2015-06-27 DIAGNOSIS — Z79899 Other long term (current) drug therapy: Secondary | ICD-10-CM | POA: Diagnosis not present

## 2015-06-27 DIAGNOSIS — E43 Unspecified severe protein-calorie malnutrition: Secondary | ICD-10-CM | POA: Diagnosis present

## 2015-06-27 DIAGNOSIS — M25552 Pain in left hip: Secondary | ICD-10-CM | POA: Diagnosis present

## 2015-06-27 DIAGNOSIS — J069 Acute upper respiratory infection, unspecified: Secondary | ICD-10-CM | POA: Diagnosis present

## 2015-06-27 DIAGNOSIS — Z419 Encounter for procedure for purposes other than remedying health state, unspecified: Secondary | ICD-10-CM

## 2015-06-27 DIAGNOSIS — Z87891 Personal history of nicotine dependence: Secondary | ICD-10-CM

## 2015-06-27 DIAGNOSIS — Y9301 Activity, walking, marching and hiking: Secondary | ICD-10-CM | POA: Diagnosis present

## 2015-06-27 DIAGNOSIS — Z66 Do not resuscitate: Secondary | ICD-10-CM | POA: Diagnosis present

## 2015-06-27 DIAGNOSIS — R630 Anorexia: Secondary | ICD-10-CM | POA: Diagnosis not present

## 2015-06-27 DIAGNOSIS — R0602 Shortness of breath: Secondary | ICD-10-CM

## 2015-06-27 LAB — BASIC METABOLIC PANEL
Anion gap: 6 (ref 5–15)
BUN: 15 mg/dL (ref 6–20)
CALCIUM: 8.4 mg/dL — AB (ref 8.9–10.3)
CO2: 29 mmol/L (ref 22–32)
CREATININE: 0.54 mg/dL (ref 0.44–1.00)
Chloride: 104 mmol/L (ref 101–111)
GFR calc Af Amer: >60 mL/min (ref >60)
GLUCOSE: 116 mg/dL — AB (ref 65–99)
Potassium: 4.7 mmol/L (ref 3.5–5.1)
SODIUM: 139 mmol/L (ref 135–145)

## 2015-06-27 LAB — CBC WITH DIFFERENTIAL/PLATELET
BASOS ABS: 0.1 10*3/uL (ref 0.0–0.1)
Basophils Relative: 0 %
EOS ABS: 0.7 10*3/uL (ref 0.0–0.7)
EOS PCT: 5 %
HCT: 45 % (ref 36.0–46.0)
Hemoglobin: 14.5 g/dL (ref 12.0–15.0)
Lymphocytes Relative: 4 %
Lymphs Abs: 0.6 10*3/uL — ABNORMAL LOW (ref 0.7–4.0)
MCH: 29.1 pg (ref 26.0–34.0)
MCHC: 32.2 g/dL (ref 30.0–36.0)
MCV: 90.2 fL (ref 78.0–100.0)
MONO ABS: 0.5 10*3/uL (ref 0.1–1.0)
Monocytes Relative: 3 %
Neutro Abs: 12.7 10*3/uL — ABNORMAL HIGH (ref 1.7–7.7)
Neutrophils Relative %: 88 %
PLATELETS: 480 10*3/uL — AB (ref 150–400)
RBC: 4.99 MIL/uL (ref 3.87–5.11)
RDW: 20.8 % — AB (ref 11.5–15.5)
WBC: 14.4 10*3/uL — AB (ref 4.0–10.5)

## 2015-06-27 MED ORDER — MORPHINE SULFATE (PF) 2 MG/ML IV SOLN
0.5000 mg | INTRAVENOUS | Status: DC | PRN
Start: 2015-06-27 — End: 2015-07-02
  Administered 2015-06-28 – 2015-06-29 (×2): 0.5 mg via INTRAVENOUS
  Filled 2015-06-27 (×3): qty 1

## 2015-06-27 MED ORDER — ALPRAZOLAM 0.5 MG PO TBDP: 0.5000 mg | ORAL_TABLET | Freq: Two times a day (BID) | ORAL | Status: DC | PRN

## 2015-06-27 MED ORDER — FENTANYL CITRATE (PF) 100 MCG/2ML IJ SOLN
50.0000 ug | INTRAMUSCULAR | Status: DC | PRN
  Administered 2015-06-27 – 2015-06-28 (×2): 50 ug via INTRAVENOUS
  Filled 2015-06-27 (×4): qty 2

## 2015-06-27 MED ORDER — PREDNISONE 20 MG PO TABS: 20.0000 mg | ORAL_TABLET | Freq: Every day | ORAL | Status: DC

## 2015-06-27 MED ORDER — ASPIRIN 81 MG PO TABS: 81.0000 mg | ORAL_TABLET | Freq: Every day | ORAL | Status: DC

## 2015-06-27 MED ORDER — HYDROCODONE-ACETAMINOPHEN 5-325 MG PO TABS
1.0000 | ORAL_TABLET | Freq: Four times a day (QID) | ORAL | Status: DC | PRN
  Administered 2015-06-28 (×2): 1 via ORAL
  Filled 2015-06-27: qty 1
  Filled 2015-06-27: qty 2

## 2015-06-27 MED ORDER — ALPRAZOLAM 0.5 MG PO TABS
0.5000 mg | ORAL_TABLET | Freq: Two times a day (BID) | ORAL | Status: DC | PRN
  Administered 2015-06-29: 0.5 mg via ORAL
  Filled 2015-06-27 (×2): qty 1

## 2015-06-27 MED ORDER — FENTANYL CITRATE (PF) 100 MCG/2ML IJ SOLN
25.0000 ug | Freq: Once | INTRAMUSCULAR | Status: AC
  Administered 2015-06-27: 25 ug via INTRAVENOUS

## 2015-06-27 MED ORDER — ASPIRIN EC 325 MG PO TBEC
325.0000 mg | DELAYED_RELEASE_TABLET | Freq: Every day | ORAL | Status: DC
Start: 2015-06-27 — End: 2015-06-28

## 2015-06-27 NOTE — ED Notes (Signed)
Bed: [id number] Expected date:  Expected time:  Means of arrival:  Comments: Ca pt from cancer ctr/hypotensive

## 2015-06-27 NOTE — ED Provider Notes (Signed)
CSN: [id number]     Arrival date & time 06/27/15  1756 History   First MD Initiated Contact with Patient 06/27/15 1811     Chief Complaint  Patient presents with  . Fall     Patient is a 82 y.o. female presenting with fall. The history is provided by the patient and a relative. No language interpreter was used.  Fall   Stacy Little is a 82 y.o. female who presents to the Emergency Department complaining of fall.  History is provided by the patient and her daughter. The patient was walking outside to look at flowers when she went to turn and fell onto her left side striking her hip. She had no head injury or loss of consciousness. She reports isolated pain in the left hip. She is brought in by EMS. She has a history of prior CVA as well as Bell's palsy. She takes a baby aspirin daily. She was recently treated with a course of prednisone and home oxygen for an upper respiratory infection. Symptoms are moderate and constant.  Past Medical History  Diagnosis Date  . Hearing loss   . Anorexia     06/27/15 - daughter states pt has not had this.  . Dizziness   . Back muscle spasm   . Generalized osteoarthritis of multiple sites   . Hypercholesteremia   . Neck mass   . Osteoarthritis, knee   . Palpitations   . Scab   . Urinary tract infection   . History of ovarian cancer    Past Surgical History  Procedure Laterality Date  . Abdominal hysterectomy      Removal of both ovaries   . Pterygium excision Left   . Lift / repair brow ptosis forehead     Family History  Problem Relation Age of Onset  . Cancer Father   . Diabetes Sister   . Heart attack Sister    Social History  Substance Use Topics  . Smoking status: Former Smoker -- 1 years    Quit date: 06/27/1954  . Smokeless tobacco: Never Used  . Alcohol Use: No   OB History    No data available     Review of Systems  All other systems reviewed and are negative.     Allergies  Review of patient's allergies indicates  no known allergies.  Home Medications   Prior to Admission medications   Medication Sig Start Date End Date Taking? Authorizing Provider  ALPRAZolam (NIRAVAM) 0.5 MG dissolvable tablet Take 1 tablet (0.5 mg total) by mouth 2 (two) times daily as needed for anxiety. 06/13/15  Yes Kimber RelicArthur G Green, MD  aspirin 81 MG tablet Take 81 mg by mouth daily.   Yes Historical Provider, MD  Calcium Carbonate-Vit D-Min (CALTRATE 600+D PLUS MINERALS PO) Take 2 tablets by mouth every morning. Take 2 daily   Yes Historical Provider, MD  erythromycin ophthalmic ointment 1 application at bedtime.   Yes Historical Provider, MD  Glucosamine-Chondroitin (MOVE FREE PO) Take 1 tablet by mouth 2 (two) times daily.    Yes Historical Provider, MD  Multiple Vitamins-Minerals (PRESERVISION AREDS 2) CAPS Take 1 tablet by mouth 2 (two) times daily. Take 2 daily   Yes Historical Provider, MD  predniSONE (DELTASONE) 20 MG tablet One each morning to help breathing Patient taking differently: Take 20 mg by mouth daily with breakfast. One each morning to help breathing 06/06/15  Yes Kimber RelicArthur G Green, MD  vitamin B-12 (CYANOCOBALAMIN) 1000 MCG tablet Take 1,000 mcg by  mouth daily.   Yes Historical Provider, MD  fluticasone furoate-vilanterol (BREO ELLIPTA) 100-25 MCG/INH AEPB Inhale into the lungs once daily Patient not taking: Reported on 06/27/2015 06/06/15   Kimber Relic, MD   BP 110/57 mmHg  Pulse 99  Temp(Src) 98.1 F (36.7 C) (Oral)  Resp 18  SpO2 90% Physical Exam  Constitutional: She is oriented to person, place, and time. She appears well-developed and well-nourished.  HENT:  Head: Normocephalic and atraumatic.  Cardiovascular: Normal rate and regular rhythm.   No murmur heard. Pulmonary/Chest: Effort normal and breath sounds normal. No respiratory distress.  Abdominal: Soft. There is no tenderness. There is no rebound and no guarding.  Musculoskeletal: She exhibits no edema.  2+ DP pulses in bilateral lower  extremities. Tenderness to palpation over the left hip and upper thigh. Position of comfort for the left hip is to be flexed at the knee.  Neurological: She is alert and oriented to person, place, and time.  Left facial droop  Skin: Skin is warm and dry.  Psychiatric: She has a normal mood and affect. Her behavior is normal.  Nursing note and vitals reviewed.   ED Course  Procedures (including critical care time) Labs Review Labs Reviewed  BASIC METABOLIC PANEL - Abnormal; Notable for the following:    Glucose, Bld 116 (*)    Calcium 8.4 (*)    All other components within normal limits  CBC WITH DIFFERENTIAL/PLATELET - Abnormal; Notable for the following:    WBC 14.4 (*)    RDW 20.8 (*)    Platelets 480 (*)    Neutro Abs 12.7 (*)    Lymphs Abs 0.6 (*)    All other components within normal limits  URINALYSIS, ROUTINE W REFLEX MICROSCOPIC (NOT AT [facility])    Imaging Review Dg Chest 1 View  06/27/2015  CLINICAL DATA:  Larey Seat at home today with left hip and groin pain, former smoker, personal history of ovarian cancer EXAM: CHEST 1 VIEW COMPARISON:  None. FINDINGS: The heart size and vascular pattern are normal. There is mild chronic appearing interstitial lung disease. There is no focal consolidation or effusion. Bony thorax is intact. IMPRESSION: No acute findings Electronically Signed   By: Esperanza Heir M.D.   On: 06/27/2015 19:25   Dg Hip Unilat With Pelvis 2-3 Views Left  06/27/2015  CLINICAL DATA:  Fall at home today, left hip pain, groin pain EXAM: DG HIP (WITH OR WITHOUT PELVIS) 2-3V LEFT COMPARISON:  None. FINDINGS: Three views of the left hip submitted. Study is markedly limited by diffuse osteopenia. There is shortening of left femoral neck. On cross-table view there is cortical irregularity at the base of left femoral neck. Findings are highly suspicious for impacted intertrochanteric fracture of the left femoral neck. IMPRESSION: Study is markedly limited by diffuse osteopenia.  There is shortening of left femoral neck. On cross-table view there is cortical irregularity at the base of left femoral neck. Findings are highly suspicious for impacted intertrochanteric fracture of the left femoral neck. Electronically Signed   By: Natasha Mead M.D.   On: 06/27/2015 19:30   I have personally reviewed and evaluated these images and lab results as part of my medical decision-making.   EKG Interpretation None      MDM   Final diagnoses:  Fall, initial encounter    Patient here for evaluation of injuries following a mechanical fall. Imaging demonstrates an impacted intertrochanteric fracture of the left femoral neck. Discussed with patient and family findings of  study. Discussed with Dr. Eulah Pont with orthopedics who recommends the patient is transferred to [facility] for admission and surgery tomorrow. Hospitalist consulted for admission. Patient and family updated of studies and admission plans.    Tilden Fossa, MD 06/27/15 [phone]

## 2015-06-27 NOTE — H&P (Signed)
[facility] History and Physical  Stacy Myronnn Herbold MVH:[id number] DOB: Jun 20, 1915 DOA: 06/27/2015  Referring physician: EDP PCP: Kirt Boysarter, Monica, DO   Chief Complaint: Fall, hip pain   HPI: Stacy Little is a 82 y.o. female who presents to the ED after a mechanical fall.  She landed on her left hip.  Left hip pain after fall, worse with movement.  Better in flexed knee positioning.  Unable to bear weight after fall, she presented to ED.  Review of Systems: Systems reviewed.  As above, otherwise negative  Past Medical History  Diagnosis Date  . Hearing loss   . Anorexia     06/27/15 - daughter states pt has not had this.  . Dizziness   . Back muscle spasm   . Generalized osteoarthritis of multiple sites   . Hypercholesteremia   . Neck mass   . Osteoarthritis, knee   . Palpitations   . Scab   . Urinary tract infection   . History of ovarian cancer    Past Surgical History  Procedure Laterality Date  . Abdominal hysterectomy      Removal of both ovaries   . Pterygium excision Left   . Lift / repair brow ptosis forehead     Social History:  reports that she quit smoking about 61 years ago. She has never used smokeless tobacco. She reports that she does not drink alcohol or use illicit drugs.  No Known Allergies  Family History  Problem Relation Age of Onset  . Cancer Father   . Diabetes Sister   . Heart attack Sister      Prior to Admission medications   Medication Sig Start Date End Date Taking? Authorizing Provider  ALPRAZolam (NIRAVAM) 0.5 MG dissolvable tablet Take 1 tablet (0.5 mg total) by mouth 2 (two) times daily as needed for anxiety. 06/13/15  Yes Kimber RelicArthur G Green, MD  aspirin 81 MG tablet Take 81 mg by mouth daily.   Yes Historical Provider, MD  Calcium Carbonate-Vit D-Min (CALTRATE 600+D PLUS MINERALS PO) Take 2 tablets by mouth every morning. Take 2 daily   Yes Historical Provider, MD  erythromycin ophthalmic ointment 1 application at bedtime.   Yes  Historical Provider, MD  Glucosamine-Chondroitin (MOVE FREE PO) Take 1 tablet by mouth 2 (two) times daily.    Yes Historical Provider, MD  Multiple Vitamins-Minerals (PRESERVISION AREDS 2) CAPS Take 1 tablet by mouth 2 (two) times daily. Take 2 daily   Yes Historical Provider, MD  predniSONE (DELTASONE) 20 MG tablet One each morning to help breathing Patient taking differently: Take 20 mg by mouth daily with breakfast. One each morning to help breathing 06/06/15  Yes Kimber RelicArthur G Green, MD  vitamin B-12 (CYANOCOBALAMIN) 1000 MCG tablet Take 1,000 mcg by mouth daily.   Yes Historical Provider, MD  fluticasone furoate-vilanterol (BREO ELLIPTA) 100-25 MCG/INH AEPB Inhale into the lungs once daily Patient not taking: Reported on 06/27/2015 06/06/15   Kimber RelicArthur G Green, MD   Physical Exam: Filed Vitals:   06/27/15 1945 06/27/15 2000  BP:  121/69  Pulse: 106   Temp:    Resp: 17 16    BP 121/69 mmHg  Pulse 106  Temp(Src) 97.6 F (36.4 C) (Oral)  Resp 16  SpO2 96%  General Appearance:    Alert, oriented, no distress, appears stated age  Head:    Normocephalic, atraumatic  Eyes:    PERRL, EOMI, sclera non-icteric        Nose:   Nares without drainage or epistaxis.  Mucosa, turbinates normal  Throat:   Moist mucous membranes. Oropharynx without erythema or exudate.  Neck:   Supple. No carotid bruits.  No thyromegaly.  No lymphadenopathy.   Back:     No CVA tenderness, no spinal tenderness  Lungs:     Clear to auscultation bilaterally, without wheezes, rhonchi or rales  Chest wall:    No tenderness to palpitation  Heart:    Regular rate and rhythm without murmurs, gallops, rubs  Abdomen:     Soft, non-tender, nondistended, normal bowel sounds, no organomegaly  Genitalia:    deferred  Rectal:    deferred  Extremities:   No clubbing, cyanosis or edema.  Pulses:   2+ and symmetric all extremities  Skin:   Skin color, texture, turgor normal, no rashes or lesions  Lymph nodes:   Cervical,  supraclavicular, and axillary nodes normal  Neurologic:   Left facial droop (chronic).    Labs on Admission:  Basic Metabolic Panel:  Recent Labs Lab 06/27/15 1835  NA 139  K 4.7  CL 104  CO2 29  GLUCOSE 116*  BUN 15  CREATININE 0.54  CALCIUM 8.4*   Liver Function Tests: No results for input(s): AST, ALT, ALKPHOS, BILITOT, PROT, ALBUMIN in the last 168 hours. No results for input(s): LIPASE, AMYLASE in the last 168 hours. No results for input(s): AMMONIA in the last 168 hours. CBC:  Recent Labs Lab 06/27/15 1835  WBC 14.4*  NEUTROABS 12.7*  HGB 14.5  HCT 45.0  MCV 90.2  PLT 480*   Cardiac Enzymes: No results for input(s): CKTOTAL, CKMB, CKMBINDEX, TROPONINI in the last 168 hours.  BNP (last 3 results) No results for input(s): PROBNP in the last 8760 hours. CBG: No results for input(s): GLUCAP in the last 168 hours.  Radiological Exams on Admission: Dg Chest 1 View  06/27/2015  CLINICAL DATA:  Lare[place] Seat at home today with left hip and groin pain, former smoker, personal history of ovarian cancer EXAM: CHEST 1 VIEW COMPARISON:  None. FINDINGS: The heart size and vascular pattern are normal. There is mild chronic appearing interstitial lung disease. There is no focal consolidation or effusion. Bony thorax is intact. IMPRESSION: No acute findings Electronically Signed   By: Esperanza Heir M.D.   On: 06/27/2015 19:25   Dg Hip Unilat With Pelvis 2-3 Views Left  06/27/2015  CLINICAL DATA:  Fall at home today, left hip pain, groin pain EXAM: DG HIP (WITH OR WITHOUT PELVIS) 2-3V LEFT COMPARISON:  None. FINDINGS: Three views of the left hip submitted. Study is markedly limited by diffuse osteopenia. There is shortening of left femoral neck. On cross-table view there is cortical irregularity at the base of left femoral neck. Findings are highly suspicious for impacted intertrochanteric fracture of the left femoral neck. IMPRESSION: Study is markedly limited by diffuse osteopenia. There  is shortening of left femoral neck. On cross-table view there is cortical irregularity at the base of left femoral neck. Findings are highly suspicious for impacted intertrochanteric fracture of the left femoral neck. Electronically Signed   By: Natasha Mead M.D.   On: 06/27/2015 19:30    EKG: Independently reviewed. S. Tach, LAFB  Assessment/Plan Principal Problem:   Hip fracture, left (HCC)   1. Left hip fracture - 1. Hip fx pathway 2. [facility] of Dr. Eulah Pont who plans on OR for repair tomorrow 3. ASA and SCDs for pre-op PPx 4. Pain control per pathway 5. NPO after midnight 2. URI - will  continue patient on prednisone that she was recently prescribed for this.  Has about 9 days left or so.    Code Status: DNR - confirmed with family including that patient will be made full code temporarily during any OR procedure Family Communication: Family at bedside Disposition Plan: admit to inpatient   Time spent: 50 min  GARDNER, JARED M. [facility] Pager [phone]  If 7AM-7PM, please contact the day team taking care of the patient [email or web address] Password [facility] 06/27/2015, 8:39 PM

## 2015-06-27 NOTE — Consult Note (Signed)
ORTHOPAEDIC CONSULTATION  REQUESTING PHYSICIAN: Quintella Reichert, MD  Chief Complaint: L hip fracture  HPI: Stacy Little is a 82 y.o. female who complains of  she suffered a mechanical fall while walking today and complains of left hip pain. She denies any other pain  Past Medical History  Diagnosis Date  . Hearing loss   . Anorexia   . Dizziness   . Back muscle spasm   . Generalized osteoarthritis of multiple sites   . Hypercholesteremia   . Neck mass   . Osteoarthritis, knee   . History of ovarian cancer   . Palpitations   . Scab   . Urinary tract infection    Past Surgical History  Procedure Laterality Date  . Abdominal hysterectomy      Removal of both ovaries   . Pterygium excision Left   . Lift / repair brow ptosis forehead     Social History   Social History  . Marital Status: Widowed    Spouse Name: N/A  . Number of Children: N/A  . Years of Education: N/A   Social History Main Topics  . Smoking status: Former Smoker -- 1 years  . Smokeless tobacco: Never Used  . Alcohol Use: 0.6 oz/week    1 Standard drinks or equivalent per week  . Drug Use: None  . Sexual Activity: Not Asked   Other Topics Concern  . None   Social History Narrative   Diet:       Do you drink/ eat things with caffeine?  1 cup Coffee am      Marital status: Widowed                              What year were you married ? 1942      Do you live in a house, apartment,assistred living, condo, trailer, etc.)? House ( Daughter )      Is it one or more stories? 2 Stories      How many persons live in your home ? 3      Do you have any pets in your home ?(please list) No      Current or past profession:      Do you exercise?                             Type & how often:      Do you have a living will? Yes      Do you have a DNR form?  Yes                     If not, do you want to discuss one?       Do you have signed POA?HPOA forms? Yes                If so, please  bring to your        appointment            Family History  Problem Relation Age of Onset  . Cancer Father   . Diabetes Sister   . Heart attack Sister    No Known Allergies Prior to Admission medications   Medication Sig Start Date End Date Taking? Authorizing Provider  ALPRAZolam (NIRAVAM) 0.5 MG dissolvable tablet Take 1 tablet (0.5 mg total) by mouth 2 (two) times daily as needed for anxiety.  06/13/15  Yes Estill Dooms, MD  aspirin 81 MG tablet Take 81 mg by mouth daily.   Yes Historical Provider, MD  Calcium Carbonate-Vit D-Min (CALTRATE 600+D PLUS MINERALS PO) Take 2 tablets by mouth every morning. Take 2 daily   Yes Historical Provider, MD  erythromycin ophthalmic ointment 1 application at bedtime.   Yes Historical Provider, MD  Glucosamine-Chondroitin (MOVE FREE PO) Take 1 tablet by mouth 2 (two) times daily.    Yes Historical Provider, MD  Multiple Vitamins-Minerals (PRESERVISION AREDS 2) CAPS Take 1 tablet by mouth 2 (two) times daily. Take 2 daily   Yes Historical Provider, MD  predniSONE (DELTASONE) 20 MG tablet One each morning to help breathing Patient taking differently: Take 20 mg by mouth daily with breakfast. One each morning to help breathing 06/06/15  Yes Estill Dooms, MD  vitamin B-12 (CYANOCOBALAMIN) 1000 MCG tablet Take 1,000 mcg by mouth daily.   Yes Historical Provider, MD  fluticasone furoate-vilanterol (BREO ELLIPTA) 100-25 MCG/INH AEPB Inhale into the lungs once daily Patient not taking: Reported on 06/27/2015 06/06/15   Estill Dooms, MD   Dg Chest 1 View  06/27/2015  CLINICAL DATA:  [place] at home today with left hip and groin pain, former smoker, personal history of ovarian cancer EXAM: CHEST 1 VIEW COMPARISON:  None. FINDINGS: The heart size and vascular pattern are normal. There is mild chronic appearing interstitial lung disease. There is no focal consolidation or effusion. Bony thorax is intact. IMPRESSION: No acute findings Electronically Signed   By:  Skipper Cliche M.D.   On: 06/27/2015 19:25   Dg Hip Unilat With Pelvis 2-3 Views Left  06/27/2015  CLINICAL DATA:  Fall at home today, left hip pain, groin pain EXAM: DG HIP (WITH OR WITHOUT PELVIS) 2-3V LEFT COMPARISON:  None. FINDINGS: Three views of the left hip submitted. Study is markedly limited by diffuse osteopenia. There is shortening of left femoral neck. On cross-table view there is cortical irregularity at the base of left femoral neck. Findings are highly suspicious for impacted intertrochanteric fracture of the left femoral neck. IMPRESSION: Study is markedly limited by diffuse osteopenia. There is shortening of left femoral neck. On cross-table view there is cortical irregularity at the base of left femoral neck. Findings are highly suspicious for impacted intertrochanteric fracture of the left femoral neck. Electronically Signed   By: Lahoma Crocker M.D.   On: 06/27/2015 19:30    Positive ROS: All other systems have been reviewed and were otherwise negative with the exception of those mentioned in the HPI and as above.  Labs cbc  Recent Labs  06/27/15 1835  WBC 14.4*  HGB 14.5  HCT 45.0  PLT 480*    Labs inflam No results for input(s): CRP in the last 72 hours.  Invalid input(s): ESR  Labs coag No results for input(s): INR, PTT in the last 72 hours.  Invalid input(s): PT   Recent Labs  06/27/15 1835  NA 139  K 4.7  CL 104  CO2 29  GLUCOSE 116*  BUN 15  CREATININE 0.54  CALCIUM 8.4*    Physical Exam: Filed Vitals:   06/27/15 1811  BP: 134/59  Pulse: 77  Temp: 97.6 F (36.4 C)  Resp: 16   General: Alert, no acute distress Cardiovascular: No pedal edema Respiratory: No cyanosis, no use of accessory musculature GI: No organomegaly, abdomen is soft and non-tender Skin: No lesions in the area of chief complaint other than those listed below in  MSK exam.  Neurologic: Sensation intact distally save for the below mentioned MSK exam Psychiatric: Patient is  competent for consent with normal mood and affect Lymphatic: No axillary or cervical lymphadenopathy  MUSCULOSKELETAL:  The left lower extremity her compartments are soft she has pain with any log roll. She is distally neurovascularly intact. Other extremities are atraumatic with painless ROM and NVI.  Assessment: Left intratrochanteric fracture  Plan: IM nail left hip.  I have discussed her elevated risk with her family and they would like to go forward with surgical fixation.    Renette Butters, MD Cell [phone]   06/27/2015 7:59 PM

## 2015-06-27 NOTE — ED Notes (Signed)
Patient transported to X-ray 

## 2015-06-27 NOTE — ED Notes (Signed)
[facility] contacted for transport  

## 2015-06-27 NOTE — ED Notes (Signed)
[facility] and was told to call back

## 2015-06-27 NOTE — ED Notes (Signed)
From home via [facility], mechanical fall with on LOC or vomiting, c/o left hip pain with ?shortening, good PMS, VSS, A/O at baseline  50 mcg Fentanyl

## 2015-06-27 NOTE — ED Notes (Signed)
Report given to Lupita Leashonna, RN at [id number]

## 2015-06-27 NOTE — Progress Notes (Signed)
EDCM spoke to patient and her daughter Stacy Little [phone] or [phone] at bedside.  Patient lives with her daughter Stacy Little.  Per Stacy Little, patient has never had home health services and does not use any dme at home.  Patient usually able to ambulate without assistive device.  Patient's daughter reports they bought a wheelchair ofr her because she has been feeling weak lately.  Patient wears oxygen prn at home per patient's daughter.  Patient's daughter unsure which agency provides oxygen, may be [facility].  Stacy Little is unsure who is the patient's POA.  She reports she has that paperwork at home, "Me and my sister might be on it, I don't know."  Patient with fracture to left hip, provided support to patient's daughter.  [facility]  No further EDCM needs at this time

## 2015-06-28 ENCOUNTER — Inpatient Hospital Stay (HOSPITAL_COMMUNITY): Payer: Medicare Other | Admitting: Anesthesiology

## 2015-06-28 ENCOUNTER — Encounter (HOSPITAL_COMMUNITY)
Admission: EM | Disposition: A | Discharge status: Skilled Nursing Facility | Payer: Self-pay | Source: Home / Self Care | Attending: Internal Medicine

## 2015-06-28 ENCOUNTER — Encounter (HOSPITAL_COMMUNITY): Payer: Self-pay | Admitting: Anesthesiology

## 2015-06-28 ENCOUNTER — Inpatient Hospital Stay: Admit: 2015-06-28 | Payer: Self-pay | Admitting: Orthopedic Surgery

## 2015-06-28 ENCOUNTER — Inpatient Hospital Stay (HOSPITAL_COMMUNITY): Payer: Medicare Other

## 2015-06-28 DIAGNOSIS — R5381 Other malaise: Secondary | ICD-10-CM

## 2015-06-28 DIAGNOSIS — F411 Generalized anxiety disorder: Secondary | ICD-10-CM

## 2015-06-28 DIAGNOSIS — E43 Unspecified severe protein-calorie malnutrition: Secondary | ICD-10-CM | POA: Insufficient documentation

## 2015-06-28 DIAGNOSIS — S72002A Fracture of unspecified part of neck of left femur, initial encounter for closed fracture: Secondary | ICD-10-CM

## 2015-06-28 DIAGNOSIS — R630 Anorexia: Secondary | ICD-10-CM

## 2015-06-28 HISTORY — PX: INTRAMEDULLARY (IM) NAIL INTERTROCHANTERIC: SHX5875

## 2015-06-28 LAB — URINALYSIS, ROUTINE W REFLEX MICROSCOPIC
BILIRUBIN URINE: NEGATIVE
GLUCOSE, UA: NEGATIVE mg/dL
HGB URINE DIPSTICK: NEGATIVE
KETONES UR: NEGATIVE mg/dL
LEUKOCYTES UA: NEGATIVE
Nitrite: NEGATIVE
PROTEIN: NEGATIVE mg/dL
Specific Gravity, Urine: 1.019 (ref 1.005–1.030)
pH: 8 (ref 5.0–8.0)

## 2015-06-28 LAB — SURGICAL PCR SCREEN
MRSA, PCR: NEGATIVE
STAPHYLOCOCCUS AUREUS: NEGATIVE

## 2015-06-28 LAB — PROTIME-INR
INR: 1.27 (ref 0.00–1.49)
Prothrombin Time: 16 seconds — ABNORMAL HIGH (ref 11.6–15.2)

## 2015-06-28 SURGERY — FIXATION, FRACTURE, INTERTROCHANTERIC, WITH INTRAMEDULLARY ROD
Anesthesia: General | Site: Hip | Laterality: Left

## 2015-06-28 MED ORDER — ASPIRIN EC 325 MG PO TBEC
325.0000 mg | DELAYED_RELEASE_TABLET | Freq: Every day | ORAL | Status: DC
  Administered 2015-06-29 – 2015-07-02 (×4): 325 mg via ORAL
  Filled 2015-06-28 (×4): qty 1

## 2015-06-28 MED ORDER — MENTHOL 3 MG MT LOZG: 1.0000 | LOZENGE | OROMUCOSAL | Status: DC | PRN

## 2015-06-28 MED ORDER — ACETAMINOPHEN 650 MG RE SUPP: 650.0000 mg | Freq: Four times a day (QID) | RECTAL | Status: DC | PRN

## 2015-06-28 MED ORDER — ACETAMINOPHEN 325 MG PO TABS: 650.0000 mg | ORAL_TABLET | Freq: Four times a day (QID) | ORAL | Status: DC | PRN

## 2015-06-28 MED ORDER — SODIUM CHLORIDE 0.9 % IV SOLN
INTRAVENOUS | Status: DC
  Administered 2015-06-28: 23:00:00 via INTRAVENOUS

## 2015-06-28 MED ORDER — CEFAZOLIN SODIUM-DEXTROSE 2-3 GM-% IV SOLR
INTRAVENOUS | Status: DC | PRN
  Administered 2015-06-28: 2 g via INTRAVENOUS

## 2015-06-28 MED ORDER — FENTANYL CITRATE (PF) 250 MCG/5ML IJ SOLN
INTRAMUSCULAR | Status: AC
  Filled 2015-06-28: qty 5

## 2015-06-28 MED ORDER — PROPOFOL 10 MG/ML IV BOLUS
INTRAVENOUS | Status: AC
  Filled 2015-06-28: qty 20

## 2015-06-28 MED ORDER — PHENYLEPHRINE HCL 10 MG/ML IJ SOLN
INTRAMUSCULAR | Status: DC | PRN
  Administered 2015-06-28: 80 ug via INTRAVENOUS

## 2015-06-28 MED ORDER — PHENOL 1.4 % MT LIQD: 1.0000 | OROMUCOSAL | Status: DC | PRN

## 2015-06-28 MED ORDER — PHENYLEPHRINE 40 MCG/ML (10ML) SYRINGE FOR IV PUSH (FOR BLOOD PRESSURE SUPPORT)
PREFILLED_SYRINGE | INTRAVENOUS | Status: AC
  Filled 2015-06-28: qty 10

## 2015-06-28 MED ORDER — HYDROCODONE-ACETAMINOPHEN 5-325 MG PO TABS: 1.0000 | ORAL_TABLET | ORAL | Status: DC | PRN

## 2015-06-28 MED ORDER — SODIUM CHLORIDE 0.9 % IV BOLUS (SEPSIS)
250.0000 mL | Freq: Once | INTRAVENOUS | Status: AC
  Administered 2015-06-28: 250 mL via INTRAVENOUS

## 2015-06-28 MED ORDER — ONDANSETRON HCL 4 MG PO TABS: 4.0000 mg | ORAL_TABLET | Freq: Four times a day (QID) | ORAL | Status: DC | PRN

## 2015-06-28 MED ORDER — FENTANYL CITRATE (PF) 100 MCG/2ML IJ SOLN
INTRAMUSCULAR | Status: DC | PRN
  Administered 2015-06-28: 100 ug via INTRAVENOUS

## 2015-06-28 MED ORDER — ONDANSETRON HCL 4 MG/2ML IJ SOLN
INTRAMUSCULAR | Status: AC
  Filled 2015-06-28: qty 2

## 2015-06-28 MED ORDER — ASPIRIN EC 325 MG PO TBEC: 325.0000 mg | DELAYED_RELEASE_TABLET | Freq: Every day | ORAL | Status: AC

## 2015-06-28 MED ORDER — GLYCOPYRROLATE 0.2 MG/ML IJ SOLN
INTRAMUSCULAR | Status: AC
  Filled 2015-06-28: qty 1

## 2015-06-28 MED ORDER — LIDOCAINE HCL (CARDIAC) 20 MG/ML IV SOLN
INTRAVENOUS | Status: DC | PRN
  Administered 2015-06-28: 50 mg via INTRAVENOUS

## 2015-06-28 MED ORDER — ONDANSETRON HCL 4 MG/2ML IJ SOLN: 4.0000 mg | Freq: Four times a day (QID) | INTRAMUSCULAR | Status: DC | PRN

## 2015-06-28 MED ORDER — PRO-STAT SUGAR FREE PO LIQD
30.0000 mL | Freq: Three times a day (TID) | ORAL | Status: DC
  Administered 2015-06-29 – 2015-07-01 (×5): 30 mL via ORAL
  Administered 2015-07-01: 15 mL via ORAL
  Administered 2015-07-02 (×2): 30 mL via ORAL
  Filled 2015-06-28 (×7): qty 30

## 2015-06-28 MED ORDER — PHENYLEPHRINE HCL 10 MG/ML IJ SOLN
10.0000 mg | INTRAVENOUS | Status: DC | PRN
  Administered 2015-06-28: 25 ug/min via INTRAVENOUS

## 2015-06-28 MED ORDER — CEFAZOLIN SODIUM-DEXTROSE 2-3 GM-% IV SOLR
INTRAVENOUS | Status: AC
  Filled 2015-06-28: qty 50

## 2015-06-28 MED ORDER — FENTANYL CITRATE (PF) 100 MCG/2ML IJ SOLN: 25.0000 ug | INTRAMUSCULAR | Status: DC | PRN

## 2015-06-28 MED ORDER — METOCLOPRAMIDE HCL 5 MG PO TABS: 5.0000 mg | ORAL_TABLET | Freq: Three times a day (TID) | ORAL | Status: DC | PRN

## 2015-06-28 MED ORDER — CALCIUM CARBONATE-VITAMIN D 500-200 MG-UNIT PO TABS
2.0000 | ORAL_TABLET | Freq: Every morning | ORAL | Status: DC
  Administered 2015-06-29 – 2015-07-02 (×4): 2 via ORAL
  Filled 2015-06-28 (×4): qty 2

## 2015-06-28 MED ORDER — PROPOFOL 10 MG/ML IV BOLUS
INTRAVENOUS | Status: DC | PRN
  Administered 2015-06-28: 100 mg via INTRAVENOUS

## 2015-06-28 MED ORDER — METOPROLOL TARTRATE 1 MG/ML IV SOLN
5.0000 mg | INTRAVENOUS | Status: DC | PRN
  Filled 2015-06-28: qty 5

## 2015-06-28 MED ORDER — LACTATED RINGERS IV SOLN
INTRAVENOUS | Status: DC | PRN
  Administered 2015-06-28: 16:00:00 via INTRAVENOUS

## 2015-06-28 MED ORDER — ERYTHROMYCIN 5 MG/GM OP OINT
1.0000 "application " | TOPICAL_OINTMENT | Freq: Every day | OPHTHALMIC | Status: DC
  Administered 2015-06-29 – 2015-07-01 (×2): 1 via OPHTHALMIC
  Filled 2015-06-28 (×2): qty 3.5

## 2015-06-28 MED ORDER — VITAMIN B-12 1000 MCG PO TABS
1000.0000 ug | ORAL_TABLET | Freq: Every day | ORAL | Status: DC
  Administered 2015-06-29 – 2015-07-02 (×4): 1000 ug via ORAL
  Filled 2015-06-28 (×4): qty 1

## 2015-06-28 MED ORDER — CEFAZOLIN SODIUM-DEXTROSE 2-3 GM-% IV SOLR
2.0000 g | Freq: Three times a day (TID) | INTRAVENOUS | Status: DC
  Administered 2015-06-29: 2 g via INTRAVENOUS
  Filled 2015-06-28 (×2): qty 50

## 2015-06-28 MED ORDER — PREDNISONE 10 MG PO TABS
10.0000 mg | ORAL_TABLET | Freq: Every day | ORAL | Status: DC
  Administered 2015-06-29: 10 mg via ORAL
  Filled 2015-06-28: qty 1

## 2015-06-28 MED ORDER — LIDOCAINE HCL (CARDIAC) 20 MG/ML IV SOLN
INTRAVENOUS | Status: AC
  Filled 2015-06-28: qty 5

## 2015-06-28 MED ORDER — VITAMIN D 1000 UNITS PO TABS
1000.0000 [IU] | ORAL_TABLET | Freq: Every day | ORAL | Status: DC
  Administered 2015-06-29 – 2015-07-02 (×4): 1000 [IU] via ORAL
  Filled 2015-06-28 (×4): qty 1

## 2015-06-28 MED ORDER — METOCLOPRAMIDE HCL 5 MG/ML IJ SOLN: 5.0000 mg | Freq: Three times a day (TID) | INTRAMUSCULAR | Status: DC | PRN

## 2015-06-28 SURGICAL SUPPLY — 38 items
BNDG COHESIVE 4X5 TAN STRL (GAUZE/BANDAGES/DRESSINGS) ×3 IMPLANT
BNDG GAUZE ELAST 4 BULKY (GAUZE/BANDAGES/DRESSINGS) ×3 IMPLANT
COVER PERINEAL POST (MISCELLANEOUS) ×3 IMPLANT
COVER SURGICAL LIGHT HANDLE (MISCELLANEOUS) ×3 IMPLANT
DRAPE STERI IOBAN 125X83 (DRAPES) ×3 IMPLANT
DRESSING ALLEVYN LIFE SACRUM (GAUZE/BANDAGES/DRESSINGS) ×3 IMPLANT
DRSG MEPILEX BORDER 4X4 (GAUZE/BANDAGES/DRESSINGS) ×6 IMPLANT
DRSG MEPILEX BORDER 4X8 (GAUZE/BANDAGES/DRESSINGS) ×3 IMPLANT
DURAPREP 26ML APPLICATOR (WOUND CARE) ×3 IMPLANT
ELECT REM PT RETURN 9FT ADLT (ELECTROSURGICAL) ×3
ELECTRODE REM PT RTRN 9FT ADLT (ELECTROSURGICAL) ×1 IMPLANT
GLOVE BIO SURGEON STRL SZ7 (GLOVE) ×6 IMPLANT
GLOVE BIO SURGEON STRL SZ7.5 (GLOVE) ×3 IMPLANT
GLOVE BIOGEL PI IND STRL 7.0 (GLOVE) ×2 IMPLANT
GLOVE BIOGEL PI IND STRL 8 (GLOVE) ×1 IMPLANT
GLOVE BIOGEL PI INDICATOR 7.0 (GLOVE) ×4
GLOVE BIOGEL PI INDICATOR 8 (GLOVE) ×2
GOWN STRL REUS W/ TWL LRG LVL3 (GOWN DISPOSABLE) ×2 IMPLANT
GOWN STRL REUS W/TWL LRG LVL3 (GOWN DISPOSABLE) ×4
GUIDEROD T2 3X1000 (ROD) ×3 IMPLANT
K-WIRE  3.2X450M STR (WIRE) ×2
K-WIRE 3.2X450M STR (WIRE) ×1
KIT ROOM TURNOVER OR (KITS) ×3 IMPLANT
KWIRE 3.2X450M STR (WIRE) ×1 IMPLANT
MANIFOLD NEPTUNE II (INSTRUMENTS) ×3 IMPLANT
NAIL 125D 10X320MM LEFT (Nail) ×3 IMPLANT
NS IRRIG 1000ML POUR BTL (IV SOLUTION) ×3 IMPLANT
PACK GENERAL/GYN (CUSTOM PROCEDURE TRAY) ×3 IMPLANT
PAD ARMBOARD 7.5X6 YLW CONV (MISCELLANEOUS) ×3 IMPLANT
PAD CAST 4YDX4 CTTN HI CHSV (CAST SUPPLIES) ×1 IMPLANT
PADDING CAST COTTON 4X4 STRL (CAST SUPPLIES) ×2
SCREW LAG GAMMA 3 TI 10.5X80MM (Screw) ×3 IMPLANT
SUT MNCRL AB 4-0 PS2 18 (SUTURE) IMPLANT
SUT MON AB 2-0 CT1 27 (SUTURE) ×3 IMPLANT
SUT VIC AB 0 CT1 27 (SUTURE) ×2
SUT VIC AB 0 CT1 27XBRD ANBCTR (SUTURE) ×1 IMPLANT
TOWEL OR 17X24 6PK STRL BLUE (TOWEL DISPOSABLE) ×3 IMPLANT
TOWEL OR 17X26 10 PK STRL BLUE (TOWEL DISPOSABLE) ×3 IMPLANT

## 2015-06-28 NOTE — Progress Notes (Signed)
Pt gets agitated with BP cycling, when xrays done, wants to be "left alone". Refuses po intake, denies pain. Frequent reassurance & reorientation provided.

## 2015-06-28 NOTE — Anesthesia Preprocedure Evaluation (Addendum)
Anesthesia Evaluation  Patient identified by MRN, date of birth, ID band Patient awake    Reviewed: Allergy & Precautions, H&P , NPO status , Patient's Chart, lab work & pertinent test results  Airway Mallampati: II  TM Distance: >3 FB Neck ROM: full    Dental  (+) Dental Advisory Given, Edentulous Upper, Missing   Pulmonary COPD, former smoker,  emphysema   Pulmonary exam normal breath sounds clear to auscultation       Cardiovascular negative cardio ROS Normal cardiovascular exam Rhythm:regular Rate:Normal  palpitations   Neuro/Psych negative neurological ROS  negative psych ROS   GI/Hepatic negative GI ROS, Neg liver ROS, Abnormal liver enzymes   Endo/Other  negative endocrine ROS  Renal/GU negative Renal ROS  negative genitourinary   Musculoskeletal   Abdominal   Peds  Hematology negative hematology ROS (+)   Anesthesia Other Findings   Reproductive/Obstetrics negative OB ROS                            Anesthesia Physical Anesthesia Plan  ASA: III  Anesthesia Plan: General   Post-op Pain Management:    Induction: Intravenous  Airway Management Planned: LMA  Additional Equipment:   Intra-op Plan:   Post-operative Plan:   Informed Consent: I have reviewed the patients History and Physical, chart, labs and discussed the procedure including the risks, benefits and alternatives for the proposed anesthesia with the patient or authorized representative who has indicated his/her understanding and acceptance.   Dental Advisory Given  Plan Discussed with: CRNA and Surgeon  Anesthesia Plan Comments:        Anesthesia Quick Evaluation

## 2015-06-28 NOTE — Progress Notes (Signed)
Family in room, fully updated. BP stable. No increase in left hip area puffiness.

## 2015-06-28 NOTE — Anesthesia Postprocedure Evaluation (Signed)
Anesthesia Post Note  Patient: Stacy Little  Procedure(s) Performed: Procedure(s) (LRB): INTRAMEDULLARY (IM) NAIL LEFT HIP (Left)  Patient location during evaluation: PACU Anesthesia Type: General Level of consciousness: awake and alert Pain management: pain level controlled Vital Signs Assessment: post-procedure vital signs reviewed and stable Respiratory status: spontaneous breathing, nonlabored ventilation, respiratory function stable and patient connected to nasal cannula oxygen Cardiovascular status: blood pressure returned to baseline and stable Postop Assessment: no signs of nausea or vomiting Anesthetic complications: no    Last Vitals:  Filed Vitals:   06/28/15 1911 06/28/15 1944  BP: 146/90 144/80  Pulse: 90   Temp: 36.1 C   Resp: 18 18    Last Pain:  Filed Vitals:   06/28/15 2000  PainSc: 0-No pain                 Leshea Jaggers L

## 2015-06-28 NOTE — Op Note (Signed)
DATE OF SURGERY:  06/28/2015  TIME: 4:33 PM  PATIENT NAME:  Stacy Little  AGE: 82 y.o.  PRE-OPERATIVE DIAGNOSIS:  Left hip fracture  POST-OPERATIVE DIAGNOSIS:  SAME  PROCEDURE:  INTRAMEDULLARY (IM) NAIL LEFT HIP  SURGEON:  Sophia Sperry D  ASSISTANT:  none  OPERATIVE IMPLANTS: Stryker Gamma Nail  PREOPERATIVE INDICATIONS:  Stacy Myronnn Horrell is a 82 y.o. year old who fell and suffered a hip fracture. She was brought into the ER and then admitted and optimized and then elected for surgical intervention.    The risks benefits and alternatives were discussed with the patient including but not limited to the risks of nonoperative treatment, versus surgical intervention including infection, bleeding, nerve injury, malunion, nonunion, hardware prominence, hardware failure, need for hardware removal, blood clots, cardiopulmonary complications, morbidity, mortality, among others, and they were willing to proceed.    OPERATIVE PROCEDURE:  The patient was brought to the operating room and placed in the supine position. General anesthesia was administered. She was placed on the fracture table.  Closed reduction was performed under C-arm guidance. Time out was then performed after sterile prep and drape. She received preoperative antibiotics.  Incision was made proximal to the greater trochanter. A guidewire was placed in the appropriate position. Confirmation was made on AP and lateral views. The above-named nail was opened. I opened the proximal femur with a reamer. I then placed the nail by hand easily down. I did not need to ream the femur.  Once the nail was completely seated, I placed a guidepin into the femoral head into the center center position. I measured the length, and then reamed the lateral cortex and up into the head. I then placed the lag screw. Slight compression was applied. Anatomic fixation achieved. Bone quality was mediocre.  I then secured the proximal interlocking bolt,  and took off a half a turn, and then removed the instruments, and took final C-arm pictures AP and lateral the entire length of the leg.   Anatomic reconstruction was achieved, and the wounds were irrigated copiously and closed with Vicryl followed by staples and sterile gauze for the skin. The patient was awakened and returned to PACU in stable and satisfactory condition. There no complications and the patient tolerated the procedure well.  She will be weightbearing as tolerated, and will be on ASA 325  for a period of four weeks after discharge.   Margarita Ranaimothy Shaye Elling, M.D.    This note was generated using a template and dragon dictation system. In light of that, I have reviewed the note and all aspects of it are applicable to this case. Any dictation errors are due to the computerized dictation system.

## 2015-06-28 NOTE — Transfer of Care (Signed)
Immediate Anesthesia Transfer of Care Note  Patient: Stacy Little  Procedure(s) Performed: Procedure(s): INTRAMEDULLARY (IM) NAIL LEFT HIP (Left)  Patient Location: PACU  Anesthesia Type:General  Level of Consciousness: awake, alert , oriented and patient cooperative  Airway & Oxygen Therapy: Patient Spontanous Breathing and Patient connected to nasal cannula oxygen  Post-op Assessment: Report given to RN and Post -op Vital signs reviewed and stable  Post vital signs: Reviewed and stable  Last Vitals:  Filed Vitals:   06/28/15 1100 06/28/15 1810  BP: 127/54 136/55  Pulse: 86 71  Temp: 36.7 C 36.5 C  Resp: 16 12    Complications: No apparent anesthesia complications

## 2015-06-28 NOTE — Progress Notes (Signed)
Orthopedic Tech Progress Note Patient Details:  [name] 09/22/1915 [id number]  Patient ID: [name], female   DOB: 09/22/1915, 82 y.o.   MRN: [id number]   Saul FordyceJennifer C Dalanie Kisner 06/28/2015, 10:52 PMTrapeze bar

## 2015-06-28 NOTE — Progress Notes (Signed)
Initial Nutrition Assessment  DOCUMENTATION CODES:   Severe malnutrition in context of chronic illness  INTERVENTION:  Once diet advances, provide 30 ml Prostat po TID, each supplement provides 100 kcal and 15 grams of protein.   Recommend obtaining new weight to fully assess weight trends.   NUTRITION DIAGNOSIS:   Malnutrition related to chronic illness as evidenced by severe depletion of body fat, severe depletion of muscle mass.  GOAL:   Patient will meet greater than or equal to 90% of their needs  MONITOR:   Diet advancement, Supplement acceptance, Weight trends, Labs, I & O's  REASON FOR ASSESSMENT:   Consult Hip fracture protocol  ASSESSMENT:   82 y.o. female who presents to the ED after a mechanical fall. She landed on her left hip. Left hip pain after fall, worse with movement. Better in flexed knee positioning. Unable to bear weight after fall, she presented to ED.  Pt is currently NPO for surgery today. Family at bedside reports pt usually eats well at home with 3 meals a day with snacks in between. No recent weight recorded. Recommend obtaining new weight to fully assess weight trends. Pt currently has Prostat ordered to aid in protein needs. RD to continue with current orders. Family reports pt has tried Ensure/Boost in the past and does not like them.   Nutrition-Focused physical exam completed. Findings are severe fat depletion, severe muscle depletion, and no edema.   Labs and medications reviewed.   Diet Order:  Diet NPO time specified  Skin:  Reviewed, no issues  Last BM:  3/8  Height:   Ht Readings from Last 1 Encounters:  06/13/15 4\' 9"  (1.448 m)    Weight:   Wt Readings from Last 1 Encounters:  06/13/15 88 lb 9.6 oz (40.189 kg)    Ideal Body Weight:  43 kg  BMI:  There is no weight on file to calculate BMI.  Estimated Nutritional Needs:   Kcal:  1200-1400  Protein:  50-60 grams  Fluid:  >/= 1.5 L/day  EDUCATION NEEDS:   No  education needs identified at this time  Roslyn SmilingStephanie Zaim Nitta, MS, RD, LDN Pager # [phone] After hours/ weekend pager # [phone]

## 2015-06-28 NOTE — Progress Notes (Signed)
No increase in sl puffiness at area below hip, ice hip on.

## 2015-06-28 NOTE — Progress Notes (Signed)
Patient Demographics:    Stacy Little, is a 82 y.o. female, DOB - 17-May-1915, ZOX:[id number]  Admit date - 06/27/2015   Admitting Physician Hillary Bow, DO  Outpatient Primary MD for the patient is Kirt Boys, DO  LOS - 1   Chief Complaint  Patient presents with  . Fall        Subjective:    Phill Myron today has, No headache, No chest pain, No abdominal pain - No Nausea, No new weakness tingling or numbness, No Cough - SOB. Pleasently confused and her only complaint is mild left hip is comfort and pain.   Assessment  & Plan :      1. Mechanical fall with left femoral fracture. Orthopedics consulted due for surgery on 06/28/2015. Request orthopedic surgeon to clarify weightbearing, DVT prophylaxis orders post surgery.  Cardio-Pulm Risk stratification for surgery and recommendations to minimize the same:-  A.Cardio-Pulmonary Risk -  this patient is a moderate to high risk  for adverse Cardio-Pulmonary  Outcome  from surgery, the risks and benefits were discussed and acceptable to the patient and her daughter Clydie Braun.  Recommendations for optimizing Cardio-Pulmonary  Risk risk factors  1. Keep SBP<140, HR<85, use Lopressor  IV q4hrs PRN, or B.Blocker drip PRN. 2. Moniotr I&Os. 3. Minimal sedation and Narcotics. 4. Good pulmunary toilet. 5. PRN Nebs and as needed oxygen to keep Pox>90% 6. Hb>8, transfuse as needed- Lasix  IV after each unit PRBC Transfused.   B.Bleeding Risk - no previous surgical complications, no easy bruising,  Antiplate meds  ASA 81 mg daily home medication, INR has been ordered.   Lab Results  Component Value Date   PLT 480* 06/27/2015                 No results found for: INR, PROTIME    Will request Surgeon to please Order DVT prophylaxis of  his/her choice, along with activity, weight bearing precautions and diet if appropriate.     2. Severe protein calorie malnutrition, cachexia with osteoporosis/osteopenia. Continue vitamin supplements, will add pro-stat, PT and supportive care.  3. History of anxiety and insomnia - any home dose benzodiazepine.   4. Mild delirium. Expect delirium to get worse while she is in house, we'll try to minimize benzodiazepines and narcotics. Fall and aspiration precautions.  5. Recent URI. Was placed on prednisone. We'll start tapering. No wheezing on exam.   Code Status : DO NOT RESUSCITATE  Family Communication  : daughter Clydie Braun on 06/28/2015  Disposition Plan  : Likely SNF in 2-3 days  Consults  :  Orthopedics Dr. Eulah Pont  Procedures  :   Due for left femoral neck fracture surgery on 06/28/2015  DVT Prophylaxis  :   SCDs   Lab Results  Component Value Date   PLT 480* 06/27/2015    Inpatient Medications  Scheduled Meds: . aspirin EC  325 mg Oral Daily  . predniSONE  20 mg Oral Q breakfast   Continuous Infusions:  PRN Meds:.ALPRAZolam, fentaNYL (SUBLIMAZE) injection, HYDROcodone-acetaminophen, morphine injection  Antibiotics  :     Anti-infectives    None        Objective:   Filed Vitals:   06/27/15 2000 06/27/15 2100 06/27/15 2223 06/28/15 0543  BP:  121/69 127/56 110/57 113/49  Pulse:   99 84  Temp:   98.1 F (36.7 C) 98 F (36.7 C)  TempSrc:   Oral Oral  Resp: 16  18 16   SpO2:   90% 98%    Wt Readings from Last 3 Encounters:  06/13/15 40.189 kg (88 lb 9.6 oz)  06/06/15 41.912 kg (92 lb 6.4 oz)  04/20/15 41.549 kg (91 lb 9.6 oz)    No intake or output data in the 24 hours ending 06/28/15 0925   Physical Exam  Awake , pleasantly confused, No new F.N deficits, Normal affect [place].AT,PERRAL Supple Neck,No JVD, No cervical lymphadenopathy appriciated.  Symmetrical Chest wall movement, Good air movement bilaterally, CTAB RRR,No Gallops,Rubs or new Murmurs,  No Parasternal Heave +ve B.Sounds, Abd Soft, No tenderness, No organomegaly appriciated, No rebound - guarding or rigidity. No Cyanosis, Clubbing or edema, No new Rash or bruise, L leg internally rotated    Data Review:   Micro Results Recent Results (from the past 240 hour(s))  Surgical pcr screen     Status: None   Collection Time: 06/28/15  3:50 AM  Result Value Ref Range Status   MRSA, PCR NEGATIVE NEGATIVE Final   Staphylococcus aureus NEGATIVE NEGATIVE Final    Comment:        The Xpert SA Assay (FDA approved for NASAL specimens in patients over 80 years of age), is one component of a comprehensive surveillance program.  Test performance has been validated by [facility] for patients greater than or equal to 80 year old. It is not intended to diagnose infection nor to guide or monitor treatment.     Radiology Reports Dg Chest 1 View  06/27/2015  CLINICAL DATA:  Larey SeatFell at home today with left hip and groin pain, former smoker, personal history of ovarian cancer EXAM: CHEST 1 VIEW COMPARISON:  None. FINDINGS: The heart size and vascular pattern are normal. There is mild chronic appearing interstitial lung disease. There is no focal consolidation or effusion. Bony thorax is intact. IMPRESSION: No acute findings Electronically Signed   By: Esperanza Heiraymond  Rubner M.D.   On: 06/27/2015 19:25   Dg Hip Unilat With Pelvis 2-3 Views Left  06/27/2015  CLINICAL DATA:  Fall at home today, left hip pain, groin pain EXAM: DG HIP (WITH OR WITHOUT PELVIS) 2-3V LEFT COMPARISON:  None. FINDINGS: Three views of the left hip submitted. Study is markedly limited by diffuse osteopenia. There is shortening of left femoral neck. On cross-table view there is cortical irregularity at the base of left femoral neck. Findings are highly suspicious for impacted intertrochanteric fracture of the left femoral neck. IMPRESSION: Study is markedly limited by diffuse osteopenia. There is shortening of left femoral neck. On  cross-table view there is cortical irregularity at the base of left femoral neck. Findings are highly suspicious for impacted intertrochanteric fracture of the left femoral neck. Electronically Signed   By: Natasha MeadLiviu  Pop M.D.   On: 06/27/2015 19:30     CBC  Recent Labs Lab 06/27/15 1835  WBC 14.4*  HGB 14.5  HCT 45.0  PLT 480*  MCV 90.2  MCH 29.1  MCHC 32.2  RDW 20.8*  LYMPHSABS 0.6*  MONOABS 0.5  EOSABS 0.7  BASOSABS 0.1    Chemistries   Recent Labs Lab 06/27/15 1835  NA 139  K 4.7  CL 104  CO2 29  GLUCOSE 116*  BUN 15  CREATININE 0.54  CALCIUM 8.4*   ------------------------------------------------------------------------------------------------------------------ No results for input(s): CHOL, HDL,  LDLCALC, TRIG, CHOLHDL, LDLDIRECT in the last 72 hours.  No results found for: HGBA1C ------------------------------------------------------------------------------------------------------------------ No results for input(s): TSH, T4TOTAL, T3FREE, THYROIDAB in the last 72 hours.  Invalid input(s): FREET3 ------------------------------------------------------------------------------------------------------------------ No results for input(s): VITAMINB12, FOLATE, FERRITIN, TIBC, IRON, RETICCTPCT in the last 72 hours.  Coagulation profile No results for input(s): INR, PROTIME in the last 168 hours.  No results for input(s): DDIMER in the last 72 hours.  Cardiac Enzymes No results for input(s): CKMB, TROPONINI, MYOGLOBIN in the last 168 hours.  Invalid input(s): CK ------------------------------------------------------------------------------------------------------------------ No results found for: BNP  Time Spent in minutes   35   Sloan Takagi K M.D on 06/28/2015 at 9:25 AM  Between 7am to 7pm - Pager - [phone]  After 7pm go to [email or web address] - password [facility]  [facility] -  Office  [phone]

## 2015-06-28 NOTE — Anesthesia Procedure Notes (Signed)
Procedure Name: LMA Insertion Date/Time: 06/28/2015 5:21 PM Performed by: Gwenyth AllegraADAMI, Piper Hassebrock Pre-anesthesia Checklist: Timeout performed, Patient identified, Emergency Drugs available, Suction available and Patient being monitored Patient Re-evaluated:Patient Re-evaluated prior to inductionOxygen Delivery Method: Circle system utilized Intubation Type: IV induction LMA: LMA inserted LMA Size: 4.0 Placement Confirmation: positive ETCO2 and breath sounds checked- equal and bilateral Tube secured with: Tape Dental Injury: Teeth and Oropharynx as per pre-operative assessment

## 2015-06-29 ENCOUNTER — Encounter (HOSPITAL_COMMUNITY): Payer: Self-pay | Admitting: Orthopedic Surgery

## 2015-06-29 ENCOUNTER — Inpatient Hospital Stay (HOSPITAL_COMMUNITY): Payer: Medicare Other

## 2015-06-29 LAB — CBC
HCT: 42.5 % (ref 36.0–46.0)
Hemoglobin: 13.3 g/dL (ref 12.0–15.0)
MCH: 27.9 pg (ref 26.0–34.0)
MCHC: 31.3 g/dL (ref 30.0–36.0)
MCV: 89.1 fL (ref 78.0–100.0)
PLATELETS: 496 10*3/uL — AB (ref 150–400)
RBC: 4.77 MIL/uL (ref 3.87–5.11)
RDW: 20.7 % — ABNORMAL HIGH (ref 11.5–15.5)
WBC: 17 10*3/uL — AB (ref 4.0–10.5)

## 2015-06-29 LAB — BASIC METABOLIC PANEL
ANION GAP: 11 (ref 5–15)
BUN: 7 mg/dL (ref 6–20)
CO2: 25 mmol/L (ref 22–32)
Calcium: 8.2 mg/dL — ABNORMAL LOW (ref 8.9–10.3)
Chloride: 102 mmol/L (ref 101–111)
Creatinine, Ser: 0.59 mg/dL (ref 0.44–1.00)
GLUCOSE: 120 mg/dL — AB (ref 65–99)
POTASSIUM: 3.9 mmol/L (ref 3.5–5.1)
Sodium: 138 mmol/L (ref 135–145)

## 2015-06-29 LAB — TSH: TSH: 1.271 u[IU]/mL (ref 0.350–4.500)

## 2015-06-29 MED ORDER — QUETIAPINE FUMARATE 25 MG PO TABS
25.0000 mg | ORAL_TABLET | Freq: Every day | ORAL | Status: DC
  Administered 2015-06-30: 25 mg via ORAL
  Filled 2015-06-29 (×4): qty 1

## 2015-06-29 MED ORDER — METOPROLOL TARTRATE 25 MG PO TABS
25.0000 mg | ORAL_TABLET | Freq: Two times a day (BID) | ORAL | Status: DC
  Administered 2015-06-29 – 2015-07-02 (×5): 25 mg via ORAL
  Filled 2015-06-29 (×7): qty 1

## 2015-06-29 MED ORDER — HYDROCODONE-ACETAMINOPHEN 5-325 MG PO TABS
1.0000 | ORAL_TABLET | Freq: Four times a day (QID) | ORAL | Status: DC | PRN
  Administered 2015-07-02: 1 via ORAL
  Filled 2015-06-29: qty 1

## 2015-06-29 MED ORDER — METOPROLOL TARTRATE 1 MG/ML IV SOLN
2.5000 mg | Freq: Once | INTRAVENOUS | Status: AC
  Administered 2015-06-29: 2.5 mg via INTRAVENOUS
  Filled 2015-06-29 (×2): qty 5

## 2015-06-29 MED ORDER — SODIUM CHLORIDE 0.9 % IV BOLUS (SEPSIS)
250.0000 mL | Freq: Once | INTRAVENOUS | Status: AC
  Administered 2015-06-29: 250 mL via INTRAVENOUS

## 2015-06-29 MED ORDER — HALOPERIDOL LACTATE 5 MG/ML IJ SOLN
2.0000 mg | Freq: Four times a day (QID) | INTRAMUSCULAR | Status: DC | PRN
  Administered 2015-07-01: 2 mg via INTRAVENOUS

## 2015-06-29 MED ORDER — PREDNISONE 5 MG PO TABS
5.0000 mg | ORAL_TABLET | Freq: Every day | ORAL | Status: DC
  Administered 2015-06-30 – 2015-07-01 (×2): 5 mg via ORAL
  Filled 2015-06-29 (×2): qty 1

## 2015-06-29 NOTE — Progress Notes (Signed)
Her pain is controlled, no c/o  Dressing is bening, 2+pulses   She is doing well WBAT ASA 325 PT

## 2015-06-29 NOTE — Progress Notes (Signed)
Pt presents from PACU with elevated HR in 170s but dropped back down to 120s; PACU reports pt was a little tachy but not as high as 170s;  Pt with history of palpitations. Pt has no orders for telemetry at this time. RN will call to order pt on telemetry.

## 2015-06-29 NOTE — OR Nursing (Signed)
Allevyn sacral pad placed/protocol begun post-op@1800  prior to pt transfer to pacu. Skin supple, slightly reddened from positioning, and intact at this time.

## 2015-06-29 NOTE — Care Management (Signed)
Utilization review completed. Camey Edell, RN Case Manager [phone]. 

## 2015-06-29 NOTE — Care Management Important Message (Signed)
Important Message  Patient Details  Name: Phill Myronnn Ebeling MRN: [id number] Date of Birth: 07/08/1915   Medicare Important Message Given:  Yes    Gillian Kluever P Yarimar Lavis 06/29/2015, 1:02 PM

## 2015-06-29 NOTE — Evaluation (Signed)
Physical Therapy Evaluation Patient Details Name: Stacy Little MRN: [id number] DOB: [birth year] Today's Date: 06/29/2015   History of Present Illness  82 y.o. year old who fell and suffered a hip fracture, now s/p IM nailing.  Clinical Impression  Patient demonstrates deficits in functional mobility as indicated below. Will need continued skilled PT to address deficits and maximize function. Will see as indicated and progress as tolerated.    Follow Up Recommendations SNF;Supervision/Assistance - 24 hour    Equipment Recommendations  Other (comment) (TBD next venue of care)    Recommendations for Other Services       Precautions / Restrictions Precautions Precautions: Fall Restrictions Weight Bearing Restrictions: Yes LLE Weight Bearing: Weight bearing as tolerated      Mobility  Bed Mobility Overal bed mobility: Needs Assistance Bed Mobility: Supine to Sit     Supine to sit: Mod assist     General bed mobility comments: moderate assist to come to EOB with use of chuck pad, increased time to perform  Transfers Overall transfer level: Needs assistance Equipment used: Rolling walker (2 wheeled) Transfers: Sit to/from Stand Sit to Stand: Mod assist         General transfer comment: moderate assist for stability in powering up to RW, modified hand placement. assist for stability in standing  Ambulation/Gait Ambulation/Gait assistance: Mod assist Ambulation Distance (Feet): 6 Feet Assistive device: Rolling walker (2 wheeled) Gait Pattern/deviations: Step-to pattern;Antalgic;Narrow base of support;Trunk flexed Gait velocity: decreased   General Gait Details: limited by pain, VCs for use of RW and sequencing, multi modal cues for safety and use of RW. LLE buckling noted   Stairs            Wheelchair Mobility    Modified Rankin (Stroke Patients Only)       Balance Overall balance assessment: History of Falls                                            Pertinent Vitals/Pain Pain Assessment: Faces Faces Pain Scale: Hurts little more Pain Location: hip Pain Descriptors / Indicators: Guarding;Grimacing Pain Intervention(s): Limited activity within patient's tolerance;Monitored during session;Repositioned    Home Living Family/patient expects to be discharged to:: Skilled nursing facility                      Prior Function Level of Independence: Independent (supervision for safety)         Comments: was walking in the yard at home look at flowers, per daughter patient does not use equipment or assistive devices     Hand Dominance   Dominant Hand: Right    Extremity/Trunk Assessment   Upper Extremity Assessment: Generalized weakness           Lower Extremity Assessment: Generalized weakness;LLE deficits/detail      Cervical / Trunk Assessment: Kyphotic  Communication   Communication: HOH  Cognition Arousal/Alertness: Awake/alert Behavior During Therapy: WFL for tasks assessed/performed Overall Cognitive Status: History of cognitive impairments - at baseline                      General Comments General comments (skin integrity, edema, etc.): Spoke with patient at length regarding mobility, safety, and disposition. Discussed SNF recommendation and answered all questions presented by daughter.    Exercises        Assessment/Plan  PT Assessment Patient needs continued PT services  PT Diagnosis Difficulty walking;Abnormality of gait;Generalized weakness;Acute pain   PT Problem List Decreased strength;Decreased activity tolerance;Decreased balance;Decreased mobility;Decreased knowledge of use of DME;Decreased safety awareness;Pain  PT Treatment Interventions DME instruction;Gait training;Functional mobility training;Therapeutic activities;Therapeutic exercise;Balance training;Patient/family education   PT Goals (Current goals can be found in the Care Plan section) Acute  Rehab PT Goals PT Goal Formulation: With patient/family Time For Goal Achievement: 07/13/15 Potential to Achieve Goals: Good    Frequency Min 2X/week   Barriers to discharge Decreased caregiver support      Co-evaluation               End of Session Equipment Utilized During Treatment: Gait belt Activity Tolerance: Patient limited by fatigue;Patient limited by pain Patient left: in chair;with call bell/phone within reach;with family/visitor present Nurse Communication: Mobility status         Time: 4098-1191 PT Time Calculation (min) (ACUTE ONLY): 24 min   Charges:   PT Evaluation $PT Eval Moderate Complexity: 1 Procedure PT Treatments $Therapeutic Activity: 8-22 mins   PT G CodesFabio Asa 2015/07/04, 1:26 PM Charlotte Crumb, PT DPT  [phone]

## 2015-06-29 NOTE — Progress Notes (Signed)
Call received at 0430 per floor RN regarding Pt with soft BP and elavated HR. [facility] NP already updated and a second 250 NS bolus ordered and infusing. Advised RN to check rectal temp while RRT en route. Rectal temp 100.8. Pt seen at bedside, resting easily awakened by voice, denies pain. Some pleasant confusion but easily reoriented. Left hip site intact, shadowing noted with small amount of new bloody drainage under dressing. CBC and BMET ordered for this am awaiting lab. Appears to be ST or SVT on tele monitor, rate 150-160. Manual BP 110/60, po2 95% on RA, RR 12, lung sounds clear. [facility] NP K. Schorr paged per floor RN, updated on VS. Advised to give additional fluid bolus and 2.5mg  lopressor. RN advised to give medications as ordered and recheck BP in 1 hour. To update Primary Provider and myself for worsening changes or irregular labs this AM.

## 2015-06-29 NOTE — Progress Notes (Addendum)
Patient Demographics:    Stacy Little, is a 82 y.o. female, DOB - 03/25/1916, RUE:[id number]  Admit date - 06/27/2015   Admitting Physician Hillary Bow, DO  Outpatient Primary MD for the patient is Kirt Boys, DO  LOS - 2   Chief Complaint  Patient presents with  . Fall        Subjective:    Stacy Little today has, No headache, No chest pain, No abdominal pain - No Nausea, No new weakness tingling or numbness, No Cough - SOB. Pleasently confused and her only complaint is mild left hip is comfort and pain.   Assessment  & Plan :      1. Mechanical fall with left femoral fracture. Orthopedics consulted She is post surgery on 06/28/2015. Seems to have tolerated the surgery well, minimal perioperative blood loss with anemia not requiring transfusion, weightbearing as tolerated, aspirin for DVT prophylaxis per orthopedics. Initiate PT may require placement.  2. Severe protein calorie malnutrition, cachexia with osteoporosis/osteopenia. Continue vitamin supplements, will add pro-stat, PT and supportive care.  3. History of anxiety and insomnia - any home dose benzodiazepine.   4. Mild delirium. Expect delirium to get worse while she is in house, we'll try to minimize benzodiazepines and narcotics. Fall and aspiration precautions.  5. Recent URI. Was placed on prednisone. We'll start tapering. No wheezing on exam.  6. Episode of SVT night of 06/28/2015. Resolved. Low-dose beta blocker, check TSH.  7. Mild leukocytosis likely reactionary from #1 and on being on prednisone, chest x-ray stable, no dysuria, fully is out, will monitor. IS added.  Code Status : DO NOT RESUSCITATE  Family Communication  : daughter Clydie Braun on 06/28/2015  Disposition Plan  : Likely SNF in 1-2 days  Consults  :   Orthopedics Dr. Eulah Pont  Procedures  :   Left femoral neck fracture surgery on 06/28/2015  DVT Prophylaxis  :   Aspirin per orthopedics, will add SCDs while she is here  Lab Results  Component Value Date   PLT 496* 06/29/2015    Inpatient Medications  Scheduled Meds: . aspirin EC  325 mg Oral Q breakfast  . calcium-vitamin D  2 tablet Oral q morning - 10a  . cholecalciferol  1,000 Units Oral Daily  . erythromycin  1 application Both Eyes QHS  . feeding supplement (PRO-STAT SUGAR FREE 64)  30 mL Oral TID WC  . metoprolol tartrate  25 mg Oral BID  . predniSONE  10 mg Oral Q breakfast  . QUEtiapine  25 mg Oral QHS  . vitamin B-12  1,000 mcg Oral Daily   Continuous Infusions:  PRN Meds:.acetaminophen **OR** acetaminophen, ALPRAZolam, fentaNYL (SUBLIMAZE) injection, haloperidol lactate, HYDROcodone-acetaminophen, metoprolol, morphine injection, [DISCONTINUED] ondansetron **OR** ondansetron (ZOFRAN) IV  Antibiotics  :     Anti-infectives    Start     Dose/Rate Route Frequency Ordered Stop   06/29/15 0100  ceFAZolin (ANCEF) IVPB 2 g/50 mL premix  Status:  Discontinued     2 g 100 mL/hr over 30 Minutes Intravenous Every 8 hours 06/28/15 1942 06/29/15 0908        Objective:   Filed Vitals:   06/29/15 0458 06/29/15 0509 06/29/15 0510 06/29/15 0616  BP:  110/60 115/66 118/58  Pulse:  154 154 118  Temp: 100.8 F (38.2 C)     TempSrc: Rectal     Resp:  12    SpO2:  95%      Wt Readings from Last 3 Encounters:  06/13/15 40.189 kg (88 lb 9.6 oz)  06/06/15 41.912 kg (92 lb 6.4 oz)  04/20/15 41.549 kg (91 lb 9.6 oz)     Intake/Output Summary (Last 24 hours) at 06/29/15 1020 Last data filed at 06/29/15 0655  Gross per 24 hour  Intake   1200 ml  Output   1275 ml  Net    -75 ml     Physical Exam  Awake , pleasantly confused, No new F.N deficits, Normal affect [place].AT,PERRAL Supple Neck,No JVD, No cervical lymphadenopathy appriciated.  Symmetrical Chest wall  movement, Good air movement bilaterally, CTAB RRR,No Gallops,Rubs or new Murmurs, No Parasternal Heave +ve B.Sounds, Abd Soft, No tenderness, No organomegaly appriciated, No rebound - guarding or rigidity. No Cyanosis, Clubbing or edema, No new Rash or bruise, L leg internally rotated    Data Review:   Micro Results Recent Results (from the past 240 hour(s))  Surgical pcr screen     Status: None   Collection Time: 06/28/15  3:50 AM  Result Value Ref Range Status   MRSA, PCR NEGATIVE NEGATIVE Final   Staphylococcus aureus NEGATIVE NEGATIVE Final    Comment:        The Xpert SA Assay (FDA approved for NASAL specimens in patients over 17 years of age), is one component of a comprehensive surveillance program.  Test performance has been validated by [facility] for patients greater than or equal to 77 year old. It is not intended to diagnose infection nor to guide or monitor treatment.     Radiology Reports Dg Chest 1 View  06/27/2015  CLINICAL DATA:  Larey Seat at home today with left hip and groin pain, former smoker, personal history of ovarian cancer EXAM: CHEST 1 VIEW COMPARISON:  None. FINDINGS: The heart size and vascular pattern are normal. There is mild chronic appearing interstitial lung disease. There is no focal consolidation or effusion. Bony thorax is intact. IMPRESSION: No acute findings Electronically Signed   By: Esperanza Heir M.D.   On: 06/27/2015 19:25   Dg Chest Port 1 View  06/29/2015  CLINICAL DATA:  Shortness of breath EXAM: PORTABLE CHEST 1 VIEW COMPARISON:  06/27/2015 FINDINGS: Heart size and vascular pattern are normal. Aortic calcifications stable. No consolidation or effusion. Mild interstitial lung disease stable. No effusion or pneumothorax. IMPRESSION: No active disease. Electronically Signed   By: Esperanza Heir M.D.   On: 06/29/2015 08:13   Dg Hip Operative Unilat With Pelvis Left  06/28/2015  CLINICAL DATA:  Intra medullary nail left femur for hip  fracture EXAM: OPERATIVE LEFT HIP (WITH PELVIS IF PERFORMED)  VIEWS TECHNIQUE: Fluoroscopic spot image(s) were submitted for interpretation post-operatively. COMPARISON:  None. FINDINGS: Fluoroscopy Time:  0 minutes 42 seconds Number of Acquired Images:  3 Intertrochanteric left femur fracture identified with intra medullary nail crossing it. The devices in anticipated position. Approximate anatomic positioning at the fracture site. IMPRESSION: Anticipated postoperative appearance Electronically Signed   By: Esperanza Heir M.D.   On: 06/28/2015 17:56   Dg Hip Unilat With Pelvis 2-3 Views Left  06/27/2015  CLINICAL DATA:  Fall at home today, left hip pain, groin pain EXAM: DG HIP (WITH OR WITHOUT PELVIS) 2-3V LEFT COMPARISON:  None. FINDINGS: Three views of the left hip submitted. Study is markedly  limited by diffuse osteopenia. There is shortening of left femoral neck. On cross-table view there is cortical irregularity at the base of left femoral neck. Findings are highly suspicious for impacted intertrochanteric fracture of the left femoral neck. IMPRESSION: Study is markedly limited by diffuse osteopenia. There is shortening of left femoral neck. On cross-table view there is cortical irregularity at the base of left femoral neck. Findings are highly suspicious for impacted intertrochanteric fracture of the left femoral neck. Electronically Signed   By: Natasha MeadLiviu  Pop M.D.   On: 06/27/2015 19:30   Dg Femur Min 2 Views Left  06/28/2015  CLINICAL DATA:  S/p ORIF left hip, pt uncooperative and combative, best obtainable EXAM: LEFT FEMUR 2 VIEWS COMPARISON:  06/28/2015, 06/27/2015 FINDINGS: Portable views are performed, showing interval intra medullary nail and lag screw fixation across the intertrochanteric fracture of the left hip. Postoperative subcutaneous gas is identified. No evidence for dislocation on the images provided. IMPRESSION: Status post ORIF of the left hip. Electronically Signed   By: Norva PavlovElizabeth   Brown M.D.   On: 06/28/2015 20:03     CBC  Recent Labs Lab 06/27/15 1835 06/29/15 0446  WBC 14.4* 17.0*  HGB 14.5 13.3  HCT 45.0 42.5  PLT 480* 496*  MCV 90.2 89.1  MCH 29.1 27.9  MCHC 32.2 31.3  RDW 20.8* 20.7*  LYMPHSABS 0.6*  --   MONOABS 0.5  --   EOSABS 0.7  --   BASOSABS 0.1  --     Chemistries   Recent Labs Lab 06/27/15 1835 06/29/15 0406  NA 139 138  K 4.7 3.9  CL 104 102  CO2 29 25  GLUCOSE 116* 120*  BUN 15 7  CREATININE 0.54 0.59  CALCIUM 8.4* 8.2*   ------------------------------------------------------------------------------------------------------------------ No results for input(s): CHOL, HDL, LDLCALC, TRIG, CHOLHDL, LDLDIRECT in the last 72 hours.  No results found for: HGBA1C ------------------------------------------------------------------------------------------------------------------ No results for input(s): TSH, T4TOTAL, T3FREE, THYROIDAB in the last 72 hours.  Invalid input(s): FREET3 ------------------------------------------------------------------------------------------------------------------ No results for input(s): VITAMINB12, FOLATE, FERRITIN, TIBC, IRON, RETICCTPCT in the last 72 hours.  Coagulation profile  Recent Labs Lab 06/28/15 1028  INR 1.27    No results for input(s): DDIMER in the last 72 hours.  Cardiac Enzymes No results for input(s): CKMB, TROPONINI, MYOGLOBIN in the last 168 hours.  Invalid input(s): CK ------------------------------------------------------------------------------------------------------------------ No results found for: BNP  Time Spent in minutes   35   SINGH,PRASHANT K M.D on 06/29/2015 at 10:20 AM  Between 7am to 7pm - Pager - [phone]  After 7pm go to [email or web address] - password [email or web address]  [facility] -  Office  [phone]

## 2015-06-29 NOTE — Progress Notes (Signed)
RN completed second order for 250cc NS bolus for HR consistent in 160s-170s. No change in HR. Temp 100.8 rectal. Ordering Provider Schorr made aware and advised RN to reduce stimulus of pt environment to encourage rest to reduce HR.  Rapid response called and suggests rest as well. RR nurse comes to floor to do assessment. Manual and dinamap show BP stable but "soft". Schorr not wanting to do lopressor at this time because of "soft" BP.  Pt with poor mental orientation but able to deny chest pain clearly. Pt rhythm progresses to SVT on telemetry. Schorr made aware. Schorr  Ordered another bolus followed by 2.5mg  IV Lopressor. Pt HR goes down below 120s. Pt shows no signs of distress. BP continues to remain stable. Schorr made aware of patients progression. RN will continue to monitor pt at this time

## 2015-06-29 NOTE — Progress Notes (Signed)
Pt on cardiac monitoring following Schorr orders; pt presents with HR sinus tachy; RN received order from Schor to bolus 250ccs NS report to provider following administration.

## 2015-06-30 LAB — BASIC METABOLIC PANEL
Anion gap: 7 (ref 5–15)
BUN: 10 mg/dL (ref 6–20)
CALCIUM: 8.1 mg/dL — AB (ref 8.9–10.3)
CO2: 29 mmol/L (ref 22–32)
CREATININE: 0.54 mg/dL (ref 0.44–1.00)
Chloride: 104 mmol/L (ref 101–111)
Glucose, Bld: 92 mg/dL (ref 65–99)
Potassium: 3.3 mmol/L — ABNORMAL LOW (ref 3.5–5.1)
SODIUM: 140 mmol/L (ref 135–145)

## 2015-06-30 LAB — GLUCOSE, CAPILLARY: GLUCOSE-CAPILLARY: 101 mg/dL — AB (ref 65–99)

## 2015-06-30 LAB — CBC
HCT: 36.6 % (ref 36.0–46.0)
Hemoglobin: 11.3 g/dL — ABNORMAL LOW (ref 12.0–15.0)
MCH: 27.3 pg (ref 26.0–34.0)
MCHC: 30.9 g/dL (ref 30.0–36.0)
MCV: 88.4 fL (ref 78.0–100.0)
PLATELETS: 412 10*3/uL — AB (ref 150–400)
RBC: 4.14 MIL/uL (ref 3.87–5.11)
RDW: 21.1 % — ABNORMAL HIGH (ref 11.5–15.5)
WBC: 14.9 10*3/uL — ABNORMAL HIGH (ref 4.0–10.5)

## 2015-06-30 MED ORDER — METOPROLOL TARTRATE 25 MG PO TABS: 25.0000 mg | ORAL_TABLET | Freq: Two times a day (BID) | ORAL | Status: AC

## 2015-06-30 MED ORDER — ALPRAZOLAM 0.5 MG PO TBDP: 0.5000 mg | ORAL_TABLET | Freq: Every evening | ORAL | Status: AC | PRN

## 2015-06-30 MED ORDER — POTASSIUM CHLORIDE CRYS ER 20 MEQ PO TBCR
40.0000 meq | EXTENDED_RELEASE_TABLET | ORAL | Status: AC
  Administered 2015-06-30 (×2): 40 meq via ORAL
  Filled 2015-06-30 (×2): qty 2

## 2015-06-30 MED ORDER — QUETIAPINE FUMARATE 25 MG PO TABS: 25.0000 mg | ORAL_TABLET | Freq: Every day | ORAL | Status: AC

## 2015-06-30 MED ORDER — HYDROCODONE-ACETAMINOPHEN 5-325 MG PO TABS: 1.0000 | ORAL_TABLET | ORAL | Status: AC | PRN

## 2015-06-30 NOTE — Progress Notes (Signed)
OT Cancellation Note  Patient Details Name: Stacy Little MRN: [id number] DOB: 1915-04-24   Cancelled Treatment:    Reason Eval/Treat Not Completed: Other (comment) Pt is Medicare and current D/C plan is SNF. No apparent immediate acute care OT needs, therefore will defer OT to SNF. If OT eval is needed please call Acute Rehab Dept. at [phone] or text page OT at [phone].    Evette GeorgesLeonard, Luara Faye Eva [phone] 06/30/2015, 7:09 AM

## 2015-06-30 NOTE — Progress Notes (Signed)
Subjective: 2 Days Post-Op Procedure(s) (LRB): INTRAMEDULLARY (IM) NAIL LEFT HIP (Left)  pt resting comfortably in bed, daughter reports minimal pain.  Pt with some confusion.  Objective: Vital signs in last 24 hours: Temp:  [97.3 F (36.3 C)-98.1 F (36.7 C)] 97.3 F (36.3 C) (03/11 0500) Pulse Rate:  [87-94] 92 (03/11 0500) Resp:  [14-16] 16 (03/11 0500) BP: (97-134)/(40-87) 134/87 mmHg (03/11 0500) SpO2:  [90 %-93 %] 90 % (03/11 0500)  Intake/Output from previous day: 03/10 0701 - 03/11 0700 In: 170 [P.O.:170] Out: 250 [Urine:250] Intake/Output this shift: Total I/O In: 120 [P.O.:120] Out: -    Recent Labs  06/27/15 1835 06/29/15 0446 06/30/15 0555  HGB 14.5 13.3 11.3*    Recent Labs  06/29/15 0446 06/30/15 0555  WBC 17.0* 14.9*  RBC 4.77 4.14  HCT 42.5 36.6  PLT 496* 412*    Recent Labs  06/29/15 0406 06/30/15 0555  NA 138 140  K 3.9 3.3*  CL 102 104  CO2 25 29  BUN 7 10  CREATININE 0.59 0.54  GLUCOSE 120* 92  CALCIUM 8.2* 8.1*    Recent Labs  06/28/15 1028  INR 1.27    Incision: dressing C/D/I  Assessment/Plan: 2 Days Post-Op Procedure(s) (LRB): INTRAMEDULLARY (IM) NAIL LEFT HIP (Left) Up with therapy  wbat LLE dispo per med team but per SW note will be at least Mon.  Margart SicklesChadwell, Nathaneil Feagans 06/30/2015, 11:56 AM

## 2015-06-30 NOTE — NC FL2 (Signed)
Stacy MEDICAID FL2 LEVEL OF CARE SCREENING TOOL     Little  Patient Name: Stacy Little Birthdate: [birth year] Sex: female Admission Date (Current Location): 06/27/2015  [facility] and [place] Number:  Producer, television/film/video and Address:  [facility], [address] [place], [place] [address]      Provider Number: [id number]  Attending Physician Name and Address:  Leroy Sea, MD  Relative Name and Phone Number:       Current Level of Care: Hospital Recommended Level of Care: Skilled Nursing Facility Prior Approval Number:    Date Approved/Denied:   PASRR Number:    Discharge Plan: SNF    Current Diagnoses: Patient Active Problem List   Diagnosis Date Noted  . Protein-calorie malnutrition, severe 06/28/2015  . Hip fracture, left (HCC) 06/27/2015  . Neutrophilia 06/13/2015  . Abnormal liver enzymes 06/13/2015  . Anxiety state 06/13/2015  . UTI (urinary tract infection) 06/13/2015  . Anorexia 06/13/2015  . Insomnia 06/13/2015  . Hallucination 06/06/2015  . Emphysema of lung (HCC) 06/06/2015  . Fatigue 06/06/2015  . Debility 06/06/2015  . Hypoxia 06/06/2015    Orientation RESPIRATION BLADDER Height & Weight     Self  Normal Continent Weight:   Height:     BEHAVIORAL SYMPTOMS/MOOD NEUROLOGICAL BOWEL NUTRITION STATUS      Continent Diet (see DC summary)  AMBULATORY STATUS COMMUNICATION OF NEEDS Skin   Extensive Assist Verbally Surgical wounds                       Personal Care Assistance Level of Assistance  Bathing, Dressing Bathing Assistance: Maximum assistance   Dressing Assistance: Maximum assistance     Functional Limitations Info  Hearing   Hearing Info: Impaired      SPECIAL CARE FACTORS FREQUENCY  PT (By licensed PT), OT (By licensed OT)     PT Frequency: 5/wk OT Frequency: 5/wk            Contractures      Additional Factors Info  Code Status, Allergies, Psychotropic Code Status  Info: DNR Allergies Info: NKA Psychotropic Info: seroquel         Current Medications (06/30/2015):  This is the current hospital active medication list Current Facility-Administered Medications  Medication Dose Route Frequency Provider Last Rate Last Dose  . acetaminophen (TYLENOL) tablet 650 mg  650 mg Oral Q6H PRN Sheral Apley, MD       Or  . acetaminophen (TYLENOL) suppository 650 mg  650 mg Rectal Q6H PRN Sheral Apley, MD      . ALPRAZolam Prudy Feeler) tablet 0.5 mg  0.5 mg Oral BID PRN Hillary Bow, DO   0.5 mg at 06/29/15 1601  . aspirin EC tablet 325 mg  325 mg Oral Q breakfast Sheral Apley, MD   325 mg at 06/30/15 0914  . calcium-vitamin D (OSCAL WITH D) 500-200 MG-UNIT per tablet 2 tablet  2 tablet Oral q morning - 10a Leroy Sea, MD   2 tablet at 06/30/15 1000  . cholecalciferol (VITAMIN D) tablet 1,000 Units  1,000 Units Oral Daily Renaee Munda, RPH   1,000 Units at 06/30/15 0915  . erythromycin ophthalmic ointment 1 application  1 application Both Eyes QHS Leroy Sea, MD   1 application at 06/29/15 2116  . feeding supplement (PRO-STAT SUGAR FREE 64) liquid 30 mL  30 mL Oral TID WC Leroy Sea, MD   30 mL  at 06/30/15 0915  . fentaNYL (SUBLIMAZE) injection 50 mcg  50 mcg Intravenous Q2H PRN Tilden FossaElizabeth Rees, MD   50 mcg at 06/28/15 0217  . haloperidol lactate (HALDOL) injection 2 mg  2 mg Intravenous Q6H PRN Leroy SeaPrashant K Singh, MD      . HYDROcodone-acetaminophen (NORCO/VICODIN) 5-325 MG per tablet 1 tablet  1 tablet Oral Q6H PRN Leroy SeaPrashant K Singh, MD      . metoprolol (LOPRESSOR) injection 5 mg  5 mg Intravenous Q4H PRN Leroy SeaPrashant K Singh, MD      . metoprolol tartrate (LOPRESSOR) tablet 25 mg  25 mg Oral BID Leroy SeaPrashant K Singh, MD   25 mg at 06/30/15 0915  . morphine 2 MG/ML injection 0.5 mg  0.5 mg Intravenous Q2H PRN Hillary BowJared M Gardner, DO   0.5 mg at 06/29/15 0310  . ondansetron (ZOFRAN) injection 4 mg  4 mg Intravenous Q6H PRN Sheral Apleyimothy D Murphy, MD      .  potassium chloride SA (K-DUR,KLOR-CON) CR tablet 40 mEq  40 mEq Oral Q4H Leroy SeaPrashant K Singh, MD   40 mEq at 06/30/15 0915  . predniSONE (DELTASONE) tablet 5 mg  5 mg Oral Q breakfast Leroy SeaPrashant K Singh, MD   5 mg at 06/30/15 0914  . QUEtiapine (SEROQUEL) tablet 25 mg  25 mg Oral QHS Leroy SeaPrashant K Singh, MD   25 mg at 06/29/15 2116  . vitamin B-12 (CYANOCOBALAMIN) tablet 1,000 mcg  1,000 mcg Oral Daily Leroy SeaPrashant K Singh, MD   1,000 mcg at 06/30/15 0915     Discharge Medications: Please see discharge summary for a list of discharge medications.  Relevant Imaging Results:  Relevant Lab Results:   Additional Information SS#: [id number]  Izora RibasHoloman, Mande Auvil M, [place]

## 2015-06-30 NOTE — Clinical Social Work Note (Signed)
Clinical Social Work Assessment  Patient Details  Name: Stacy Little MRN: [id number] Date of Birth: [birth year]  Date of referral:  06/30/15               Reason for consult:  Facility Placement                Permission sought to share information with:  Family Supports Permission granted to share information::  Yes, Verbal Permission Granted  Name::     Clydie BraunKaren  [facility]  Relationship::  dtr  Contact Information:     Housing/Transportation Living arrangements for the past 2 months:  Skilled Building survey[facility]ursing Facility Source of Information:  Adult Children Patient Interpreter Needed:  None Criminal Activity/Legal Involvement Pertinent to Current Situation/Hospitalization:  No - Comment as needed Significant Relationships:  Adult Children Lives with:  Adult Children Do you feel safe going back to the place where you live?  No Need for family participation in patient care:  Yes (Comment) (decision making)  Care giving concerns: pt lives at home with elderly daughter and son in law who are unable to care for her current physical needs after surgery   Social Worker assessment / plan:  CSW spoke with pt dtr concerning PT recommendation for SNF- explained referral process.  Pt dtr reports that pt moved to [place] in October to live with her family but wants pt to go to short term SNF prior to return home to expedite recovery.  Employment status:  Retired Health and safety inspectornsurance information:  Medicare PT Recommendations:  Skilled Nursing Facility Information / Referral to community resources:  Skilled Nursing Facility  Patient/Family's Response to care:  Pt dtr is agreeable to SNF placement for short term rehab- prefers facility close to home.  Patient/Family's Understanding of and Emotional Response to Diagnosis, Current Treatment, and Prognosis:  No questions or concerns at this time- dtr is hopeful for pt full recovery mentally and physically from this admission  Emotional  Assessment Appearance:  Appears stated age Attitude/Demeanor/Rapport:  Unable to Assess Affect (typically observed):  Unable to Assess Orientation:  Oriented to Self Alcohol / Substance use:  Not Applicable Psych involvement (Current and /or in the community):  No (Comment)  Discharge Needs  Concerns to be addressed:  Care Coordination Readmission within the last 30 days:  No Current discharge risk:  Physical Impairment Barriers to Discharge:  Continued Medical Work up   Izora RibasHoloman, Carli Lefevers M, LCSW 06/30/2015, 2:28 PM

## 2015-06-30 NOTE — Discharge Summary (Addendum)
Stacy Little, is a 82 y.o. female  DOB [birth year]  MRN [id number].  Admission date:  06/27/2015  Admitting Physician  Hillary Bow, DO  Discharge Date:  07/02/2015   Primary MD  Kirt Boys, DO  Recommendations for primary care physician for things to follow:   Check CBC, BMP in 3-4 days.   Admission Diagnosis  Fall, initial encounter [W19.XXXA]   Discharge Diagnosis  Fall, initial encounter [W19.XXXA]     Principal Problem:   Hip fracture, left (HCC) Active Problems:   Emphysema of lung (HCC)   Debility   Anxiety state   Anorexia   Protein-calorie malnutrition, severe      Past Medical History  Diagnosis Date  . Hearing loss   . Anorexia     06/27/15 - daughter states pt has not had this.  . Dizziness   . Back muscle spasm   . Generalized osteoarthritis of multiple sites   . Hypercholesteremia   . Neck mass   . Osteoarthritis, knee   . Palpitations   . Scab   . Urinary tract infection   . History of ovarian cancer     Past Surgical History  Procedure Laterality Date  . Abdominal hysterectomy      Removal of both ovaries   . Pterygium excision Left   . Lift / repair brow ptosis forehead    . Intramedullary (im) nail intertrochanteric Left 06/28/2015    Procedure: INTRAMEDULLARY (IM) NAIL LEFT HIP;  Surgeon: Sheral Apley, MD;  Location: [facility] OR;  Service: Orthopedics;  Laterality: Left;       HPI  from the history and physical done on the day of admission:   Stacy Little is a 82 y.o. female who presents to the ED after a mechanical fall. She landed on her left hip. Left hip pain after fall, worse with movement. Better in flexed knee positioning. Unable to bear weight after fall, she presented to ED.     Hospital Course:     1. Mechanical fall with left femoral fracture.  Orthopedics consulted She is post surgery on 06/28/2015. Seems to have tolerated the surgery well, minimal perioperative blood loss with anemia not requiring transfusion, weightbearing as tolerated, aspirin for DVT prophylaxis per orthopedics. Initiated PT She will require SNF, we'll discharge to SNF.  2. Severe protein calorie malnutrition, cachexia with osteoporosis/osteopenia. Continue vitamin supplements, gave pro-stat here, PT and supportive care.  3. History of anxiety and insomnia - continue her home dose benzodiazepine.   4. Mild delirium. Expect delirium to get worse while she is in house, we'll try to minimize benzodiazepines and narcotics. Fall and aspiration precautions. Much improved on low-dose Seroquel which will be continued on discharge.  5. Recent URI. Was placed on prednisone. Tapered of prednisone. No wheezing on exam.  6. Episode of SVT night of 06/28/2015. Resolved. Low-dose beta blocker, stable TSH.  7. Mild leukocytosis likely reactionary from #1 and on being on prednisone, chest x-ray stable, no dysuria, foley is out, continue IS, repeat CBC  in 5-7 days.  8. Hypokalemia. Replaced potassium. Repeat BMP in 5-7 days.   Discharge Condition: Stable  Follow UP  Follow-up Information    Follow up with MURPHY, TIMOTHY D, MD In 2 weeks.   Specialty:  Orthopedic Surgery   Contact information:   [address] [place] [address] [phone]        Consults obtained - Orthopedics  Diet and Activity recommendation: See Discharge Instructions below  Discharge Instructions           Discharge Instructions    Diet - low sodium heart healthy    Complete by:  As directed      Discharge instructions    Complete by:  As directed   Keep incisions covered until follow up  Follow with Primary MD Kirt Boys, DO in 7 days   Get CBC, CMP, 2 view Chest X ray checked  by Primary MD next visit.    Activity: Weight bearing as tolerated with Full  fall precautions use walker/cane & assistance as needed   Disposition SNF   Diet:   Heart Healthy  with feeding assistance and aspiration precautions.  For Heart failure patients - Check your Weight same time everyday, if you gain over 2 pounds, or you develop in leg swelling, experience more shortness of breath or chest pain, call your Primary MD immediately. Follow Cardiac Low Salt Diet and 1.5 lit/day fluid restriction.   On your next visit with your primary care physician please Get Medicines reviewed and adjusted.   Please request your Prim.MD to go over all Hospital Tests and Procedure/Radiological results at the follow up, please get all Hospital records sent to your Prim MD by signing hospital release before you go home.   If you experience worsening of your admission symptoms, develop shortness of breath, life threatening emergency, suicidal or homicidal thoughts you must seek medical attention immediately by calling 911 or calling your MD immediately  if symptoms less severe.  You Must read complete instructions/literature along with all the possible adverse reactions/side effects for all the Medicines you take and that have been prescribed to you. Take any new Medicines after you have completely understood and accpet all the possible adverse reactions/side effects.   Do not drive, operating heavy machinery, perform activities at heights, swimming or participation in water activities or provide baby sitting services if your were admitted for syncope or siezures until you have seen by Primary MD or a Neurologist and advised to do so again.  Do not drive when taking Pain medications.    Do not take more than prescribed Pain, Sleep and Anxiety Medications  Special Instructions: If you have smoked or chewed Tobacco  in the last 2 yrs please stop smoking, stop any regular Alcohol  and or any Recreational drug use.  Wear Seat belts while driving.   Please note  You were cared  for by a hospitalist during your hospital stay. If you have any questions about your discharge medications or the care you received while you were in the hospital after you are discharged, you can call the unit and asked to speak with the hospitalist on call if the hospitalist that took care of you is not available. Once you are discharged, your primary care physician will handle any further medical issues. Please note that NO REFILLS for any discharge medications will be authorized once you are discharged, as it is imperative that you return to your primary care physician (or establish a  relationship with a primary care physician if you do not have one) for your aftercare needs so that they can reassess your need for medications and monitor your lab values.     Increase activity slowly    Complete by:  As directed      Weight bearing as tolerated    Complete by:  As directed              Discharge Medications       Medication List    STOP taking these medications        aspirin 81 MG tablet  Replaced by:  aspirin EC 325 MG tablet     predniSONE 20 MG tablet  Commonly known as:  DELTASONE      TAKE these medications        ALPRAZolam 0.5 MG dissolvable tablet  Commonly known as:  NIRAVAM  Take 1 tablet (0.5 mg total) by mouth at bedtime as needed for anxiety.     aspirin EC 325 MG tablet  Take 1 tablet (325 mg total) by mouth daily.     CALTRATE 600+D PLUS MINERALS PO  Take 2 tablets by mouth every morning. Take 2 daily     erythromycin ophthalmic ointment  1 application at bedtime.     fluticasone furoate-vilanterol 100-25 MCG/INH Aepb  Commonly known as:  BREO ELLIPTA  Inhale into the lungs once daily     HYDROcodone-acetaminophen 5-325 MG tablet  Commonly known as:  NORCO  Take 1 tablet by mouth every 4 (four) hours as needed for moderate pain.     metoprolol tartrate 25 MG tablet  Commonly known as:  LOPRESSOR  Take 1 tablet (25 mg total) by mouth 2 (two) times  daily.     MOVE FREE PO  Take 1 tablet by mouth 2 (two) times daily.     PRESERVISION AREDS 2 Caps  Take 1 tablet by mouth 2 (two) times daily. Take 2 daily     QUEtiapine 25 MG tablet  Commonly known as:  SEROQUEL  Take 1 tablet (25 mg total) by mouth at bedtime.     vitamin B-12 1000 MCG tablet  Commonly known as:  CYANOCOBALAMIN  Take 1,000 mcg by mouth daily.        Major procedures and Radiology Reports - PLEASE review detailed and final reports for all details, in brief -       Dg Chest 1 View  06/27/2015  CLINICAL DATA:  Larey Seat at home today with left hip and groin pain, former smoker, personal history of ovarian cancer EXAM: CHEST 1 VIEW COMPARISON:  None. FINDINGS: The heart size and vascular pattern are normal. There is mild chronic appearing interstitial lung disease. There is no focal consolidation or effusion. Bony thorax is intact. IMPRESSION: No acute findings Electronically Signed   By: Esperanza Heir M.D.   On: 06/27/2015 19:25   Dg Chest Port 1 View  06/29/2015  CLINICAL DATA:  Shortness of breath EXAM: PORTABLE CHEST 1 VIEW COMPARISON:  06/27/2015 FINDINGS: Heart size and vascular pattern are normal. Aortic calcifications stable. No consolidation or effusion. Mild interstitial lung disease stable. No effusion or pneumothorax. IMPRESSION: No active disease. Electronically Signed   By: Esperanza Heir M.D.   On: 06/29/2015 08:13   Dg Hip Operative Unilat With Pelvis Left  06/28/2015  CLINICAL DATA:  Intra medullary nail left femur for hip fracture EXAM: OPERATIVE LEFT HIP (WITH PELVIS IF PERFORMED)  VIEWS TECHNIQUE: Fluoroscopic spot image(s) were submitted  for interpretation post-operatively. COMPARISON:  None. FINDINGS: Fluoroscopy Time:  0 minutes 42 seconds Number of Acquired Images:  3 Intertrochanteric left femur fracture identified with intra medullary nail crossing it. The devices in anticipated position. Approximate anatomic positioning at the fracture site.  IMPRESSION: Anticipated postoperative appearance Electronically Signed   By: Esperanza Heir M.D.   On: 06/28/2015 17:56   Dg Hip Unilat With Pelvis 2-3 Views Left  06/27/2015  CLINICAL DATA:  Fall at home today, left hip pain, groin pain EXAM: DG HIP (WITH OR WITHOUT PELVIS) 2-3V LEFT COMPARISON:  None. FINDINGS: Three views of the left hip submitted. Study is markedly limited by diffuse osteopenia. There is shortening of left femoral neck. On cross-table view there is cortical irregularity at the base of left femoral neck. Findings are highly suspicious for impacted intertrochanteric fracture of the left femoral neck. IMPRESSION: Study is markedly limited by diffuse osteopenia. There is shortening of left femoral neck. On cross-table view there is cortical irregularity at the base of left femoral neck. Findings are highly suspicious for impacted intertrochanteric fracture of the left femoral neck. Electronically Signed   By: Natasha Mead M.D.   On: 06/27/2015 19:30   Dg Femur Min 2 Views Left  06/28/2015  CLINICAL DATA:  S/p ORIF left hip, pt uncooperative and combative, best obtainable EXAM: LEFT FEMUR 2 VIEWS COMPARISON:  06/28/2015, 06/27/2015 FINDINGS: Portable views are performed, showing interval intra medullary nail and lag screw fixation across the intertrochanteric fracture of the left hip. Postoperative subcutaneous gas is identified. No evidence for dislocation on the images provided. IMPRESSION: Status post ORIF of the left hip. Electronically Signed   By: Norva Pavlov M.D.   On: 06/28/2015 20:03    Micro Results      Recent Results (from the past 240 hour(s))  Surgical pcr screen     Status: None   Collection Time: 06/28/15  3:50 AM  Result Value Ref Range Status   MRSA, PCR NEGATIVE NEGATIVE Final   Staphylococcus aureus NEGATIVE NEGATIVE Final    Comment:        The Xpert SA Assay (FDA approved for NASAL specimens in patients over 41 years of age), is one component of a  comprehensive surveillance program.  Test performance has been validated by [facility] for patients greater than or equal to 65 year old. It is not intended to diagnose infection nor to guide or monitor treatment.        Today   Subjective    Phill Myron today has no headache,no chest abdominal pain,no new weakness tingling or numbness, feels much better wants to go home today.     Objective   Blood pressure 111/37, pulse 85, temperature 97.7 F (36.5 C), temperature source Oral, resp. rate 16, SpO2 94 %.   Intake/Output Summary (Last 24 hours) at 07/02/15 1126 Last data filed at 07/01/15 1845  Gross per 24 hour  Intake    100 ml  Output     51 ml  Net     49 ml    Exam Awake Alert, Oriented x 2, No new F.N deficits, Normal affect [place].AT,PERRAL Supple Neck,No JVD, No cervical lymphadenopathy appriciated.  Symmetrical Chest wall movement, Good air movement bilaterally, CTAB RRR,No Gallops,Rubs or new Murmurs, No Parasternal Heave +ve B.Sounds, Abd Soft, Non tender, No organomegaly appriciated, No rebound -guarding or rigidity. No Cyanosis, Clubbing or edema, No new Rash or bruise, Left hip postop scar stable   Data Review   CBC  w Diff:  Lab Results  Component Value Date   WBC 14.9* 06/30/2015   WBC 34.1* 06/13/2015   HGB 11.3* 06/30/2015   HCT 36.6 06/30/2015   HCT 49.6* 06/13/2015   PLT 412* 06/30/2015   LYMPHOPCT 4 06/27/2015   MONOPCT 3 06/27/2015   EOSPCT 5 06/27/2015   BASOPCT 0 06/27/2015    CMP:  Lab Results  Component Value Date   NA 140 06/30/2015   NA 141 06/13/2015   K 4.2 07/01/2015   CL 104 06/30/2015   CO2 29 06/30/2015   BUN 10 06/30/2015   BUN 16 06/13/2015   CREATININE 0.54 06/30/2015   CREATININE 0.6 02/20/2015   GLU 90 02/20/2015   PROT 5.7* 06/13/2015   ALBUMIN 3.1* 06/13/2015   BILITOT 0.3 06/13/2015   ALKPHOS 94 06/13/2015   AST 30 06/13/2015   ALT 26 06/13/2015  .   Total Time in preparing paper work, data  evaluation and todays exam - 35 minutes  Leroy SeaSINGH,PRASHANT K M.D on 07/02/2015 at 11:26 AM  [facility]   Office  [phone]

## 2015-06-30 NOTE — Progress Notes (Signed)
CSW working with pt daughter on placement options- dtr is agreeable to SNF placement in [place]  CSW initiated referral process and submitted for PASAR number- pt PASAR is under review due to anxiety and current confusion level- unable to call PASAR over weekend and likely PASAR number will not be received until Monday  Pt can NOT be discharged to SNF until PASAR number is received.    CSW will continue to follow  Merlyn LotJenna Holoman, [facility] LPCSWA Clinical Social Worker [phone]

## 2015-06-30 NOTE — Discharge Instructions (Signed)
Keep incisions covered until follow up  Follow with Primary MD Stacy Little, Monica, DO in 7 days   Get CBC, CMP, 2 view Chest X ray checked  by Primary MD next visit.    Activity: Weight bearing as tolerated with Full fall precautions use walker/cane & assistance as needed   Disposition SNF   Diet:   Heart Healthy  with feeding assistance and aspiration precautions.  For Heart failure patients - Check your Weight same time everyday, if you gain over 2 pounds, or you develop in leg swelling, experience more shortness of breath or chest pain, call your Primary MD immediately. Follow Cardiac Low Salt Diet and 1.5 lit/day fluid restriction.   On your next visit with your primary care physician please Get Medicines reviewed and adjusted.   Please request your Prim.MD to go over all Hospital Tests and Procedure/Radiological results at the follow up, please get all Hospital records sent to your Prim MD by signing hospital release before you go home.   If you experience worsening of your admission symptoms, develop shortness of breath, life threatening emergency, suicidal or homicidal thoughts you must seek medical attention immediately by calling 911 or calling your MD immediately  if symptoms less severe.  You Must read complete instructions/literature along with all the possible adverse reactions/side effects for all the Medicines you take and that have been prescribed to you. Take any new Medicines after you have completely understood and accpet all the possible adverse reactions/side effects.   Do not drive, operating heavy machinery, perform activities at heights, swimming or participation in water activities or provide baby sitting services if your were admitted for syncope or siezures until you have seen by Primary MD or a Neurologist and advised to do so again.  Do not drive when taking Pain medications.    Do not take more than prescribed Pain, Sleep and Anxiety Medications  Special  Instructions: If you have smoked or chewed Tobacco  in the last 2 yrs please stop smoking, stop any regular Alcohol  and or any Recreational drug use.  Wear Seat belts while driving.   Please note  You were cared for by a hospitalist during your hospital stay. If you have any questions about your discharge medications or the care you received while you were in the hospital after you are discharged, you can call the unit and asked to speak with the hospitalist on call if the hospitalist that took care of you is not available. Once you are discharged, your primary care physician will handle any further medical issues. Please note that NO REFILLS for any discharge medications will be authorized once you are discharged, as it is imperative that you return to your primary care physician (or establish a relationship with a primary care physician if you do not have one) for your aftercare needs so that they can reassess your need for medications and monitor your lab values.

## 2015-07-01 DIAGNOSIS — E43 Unspecified severe protein-calorie malnutrition: Secondary | ICD-10-CM

## 2015-07-01 DIAGNOSIS — J438 Other emphysema: Secondary | ICD-10-CM

## 2015-07-01 LAB — POTASSIUM: POTASSIUM: 4.2 mmol/L (ref 3.5–5.1)

## 2015-07-01 NOTE — Progress Notes (Signed)
Patient Demographics:    Stacy Little, is a 82 y.o. female, DOB - [birth year], RUE:[id number]  Admit date - 06/27/2015   Admitting Physician Hillary Bow, DO  Outpatient Primary MD for the patient is Kirt Boys, DO  LOS - 4   Chief Complaint  Patient presents with  . Fall        Subjective:    Stacy Little today has, No headache, No chest pain, No abdominal pain - No Nausea, No new weakness tingling or numbness, No Cough - SOB. Today she is not confused and her only complaint is mild left hip is comfort and pain.   Assessment  & Plan :      1. Mechanical fall with left femoral fracture. Orthopedics consulted She is post surgery on 06/28/2015. Seems to have tolerated the surgery well, minimal perioperative blood loss with anemia not requiring transfusion, weightbearing as tolerated, aspirin for DVT prophylaxis per orthopedics.She did PT, has been discharged to SNF await bed.  2. Severe protein calorie malnutrition, cachexia with osteoporosis/osteopenia. Continue vitamin supplements, will add pro-stat, PT and supportive care.  3. History of anxiety and insomnia - any home dose benzodiazepine.   4. Mild delirium. Expect delirium to get worse while she is in house, we'll try to minimize benzodiazepines and narcotics. Fall and aspiration precautions.  5. Recent URI. Was placed on prednisone. Clinically resolved, Prednisone stopped.  6. Episode of SVT night of 06/28/2015. Resolved. Low-dose beta blocker, stable TSH.  7. Mild leukocytosis likely reactionary from #1 and on being on prednisone, chest x-ray stable, no dysuria, fully is out, will monitor. IS added.   Code Status : DO NOT RESUSCITATE  Family Communication  : daughter Clydie Braun on 06/28/2015 and 06/29/2015  Disposition Plan  :  Likely SNF in 1-2 days  Consults  :  Orthopedics Dr. Eulah Pont  Procedures  :   Left femoral neck fracture surgery on 06/28/2015  DVT Prophylaxis  :   Aspirin per orthopedics, will add SCDs while she is here  Lab Results  Component Value Date   PLT 412* 06/30/2015    Inpatient Medications  Scheduled Meds: . aspirin EC  325 mg Oral Q breakfast  . calcium-vitamin D  2 tablet Oral q morning - 10a  . cholecalciferol  1,000 Units Oral Daily  . erythromycin  1 application Both Eyes QHS  . feeding supplement (PRO-STAT SUGAR FREE 64)  30 mL Oral TID WC  . metoprolol tartrate  25 mg Oral BID  . predniSONE  5 mg Oral Q breakfast  . QUEtiapine  25 mg Oral QHS  . vitamin B-12  1,000 mcg Oral Daily   Continuous Infusions:  PRN Meds:.acetaminophen **OR** acetaminophen, ALPRAZolam, fentaNYL (SUBLIMAZE) injection, haloperidol lactate, HYDROcodone-acetaminophen, metoprolol, morphine injection, [DISCONTINUED] ondansetron **OR** ondansetron (ZOFRAN) IV  Antibiotics  :     Anti-infectives    Start     Dose/Rate Route Frequency Ordered Stop   06/29/15 0100  ceFAZolin (ANCEF) IVPB 2 g/50 mL premix  Status:  Discontinued     2 g 100 mL/hr over 30 Minutes Intravenous Every 8 hours 06/28/15 1942 06/29/15 0908        Objective:   Filed Vitals:   06/30/15 1615 06/30/15 2001 07/01/15 0700 07/01/15 1478  BP: 122/59 118/47 108/62 99/41  Pulse: 93 92 108 95  Temp: 97.5 F (36.4 C) 97.5 F (36.4 C) 97.8 F (36.6 C)   TempSrc:  Oral Oral   Resp: 16 16 18    SpO2: 90% 91% 92%     Wt Readings from Last 3 Encounters:  06/13/15 40.189 kg (88 lb 9.6 oz)  06/06/15 41.912 kg (92 lb 6.4 oz)  04/20/15 41.549 kg (91 lb 9.6 oz)     Intake/Output Summary (Last 24 hours) at 07/01/15 1117 Last data filed at 07/01/15 0700  Gross per 24 hour  Intake    360 ml  Output    350 ml  Net     10 ml     Physical Exam  Awake , pleasantly confused, No new F.N deficits, Normal affect [place].AT,PERRAL Supple  Neck,No JVD, No cervical lymphadenopathy appriciated.  Symmetrical Chest wall movement, Good air movement bilaterally, CTAB RRR,No Gallops,Rubs or new Murmurs, No Parasternal Heave +ve B.Sounds, Abd Soft, No tenderness, No organomegaly appriciated, No rebound - guarding or rigidity. No Cyanosis, Clubbing or edema, No new Rash or bruise, L leg internally rotated    Data Review:   Micro Results Recent Results (from the past 240 hour(s))  Surgical pcr screen     Status: None   Collection Time: 06/28/15  3:50 AM  Result Value Ref Range Status   MRSA, PCR NEGATIVE NEGATIVE Final   Staphylococcus aureus NEGATIVE NEGATIVE Final    Comment:        The Xpert SA Assay (FDA approved for NASAL specimens in patients over 80 years of age), is one component of a comprehensive surveillance program.  Test performance has been validated by [facility] for patients greater than or equal to 612 year old. It is not intended to diagnose infection nor to guide or monitor treatment.     Radiology Reports Dg Chest 1 View  06/27/2015  CLINICAL DATA:  Larey SeatFell at home today with left hip and groin pain, former smoker, personal history of ovarian cancer EXAM: CHEST 1 VIEW COMPARISON:  None. FINDINGS: The heart size and vascular pattern are normal. There is mild chronic appearing interstitial lung disease. There is no focal consolidation or effusion. Bony thorax is intact. IMPRESSION: No acute findings Electronically Signed   By: Esperanza Heiraymond  Rubner M.D.   On: 06/27/2015 19:25   Dg Chest Port 1 View  06/29/2015  CLINICAL DATA:  Shortness of breath EXAM: PORTABLE CHEST 1 VIEW COMPARISON:  06/27/2015 FINDINGS: Heart size and vascular pattern are normal. Aortic calcifications stable. No consolidation or effusion. Mild interstitial lung disease stable. No effusion or pneumothorax. IMPRESSION: No active disease. Electronically Signed   By: Esperanza Heiraymond  Rubner M.D.   On: 06/29/2015 08:13   Dg Hip Operative Unilat With Pelvis  Left  06/28/2015  CLINICAL DATA:  Intra medullary nail left femur for hip fracture EXAM: OPERATIVE LEFT HIP (WITH PELVIS IF PERFORMED)  VIEWS TECHNIQUE: Fluoroscopic spot image(s) were submitted for interpretation post-operatively. COMPARISON:  None. FINDINGS: Fluoroscopy Time:  0 minutes 42 seconds Number of Acquired Images:  3 Intertrochanteric left femur fracture identified with intra medullary nail crossing it. The devices in anticipated position. Approximate anatomic positioning at the fracture site. IMPRESSION: Anticipated postoperative appearance Electronically Signed   By: Esperanza Heiraymond  Rubner M.D.   On: 06/28/2015 17:56   Dg Hip Unilat With Pelvis 2-3 Views Left  06/27/2015  CLINICAL DATA:  Fall at home today, left hip pain, groin pain EXAM: DG HIP (WITH OR WITHOUT  PELVIS) 2-3V LEFT COMPARISON:  None. FINDINGS: Three views of the left hip submitted. Study is markedly limited by diffuse osteopenia. There is shortening of left femoral neck. On cross-table view there is cortical irregularity at the base of left femoral neck. Findings are highly suspicious for impacted intertrochanteric fracture of the left femoral neck. IMPRESSION: Study is markedly limited by diffuse osteopenia. There is shortening of left femoral neck. On cross-table view there is cortical irregularity at the base of left femoral neck. Findings are highly suspicious for impacted intertrochanteric fracture of the left femoral neck. Electronically Signed   By: Natasha Mead M.D.   On: 06/27/2015 19:30   Dg Femur Min 2 Views Left  06/28/2015  CLINICAL DATA:  S/p ORIF left hip, pt uncooperative and combative, best obtainable EXAM: LEFT FEMUR 2 VIEWS COMPARISON:  06/28/2015, 06/27/2015 FINDINGS: Portable views are performed, showing interval intra medullary nail and lag screw fixation across the intertrochanteric fracture of the left hip. Postoperative subcutaneous gas is identified. No evidence for dislocation on the images provided. IMPRESSION:  Status post ORIF of the left hip. Electronically Signed   By: Norva Pavlov M.D.   On: 06/28/2015 20:03     CBC  Recent Labs Lab 06/27/15 1835 06/29/15 0446 06/30/15 0555  WBC 14.4* 17.0* 14.9*  HGB 14.5 13.3 11.3*  HCT 45.0 42.5 36.6  PLT 480* 496* 412*  MCV 90.2 89.1 88.4  MCH 29.1 27.9 27.3  MCHC 32.2 31.3 30.9  RDW 20.8* 20.7* 21.1*  LYMPHSABS 0.6*  --   --   MONOABS 0.5  --   --   EOSABS 0.7  --   --   BASOSABS 0.1  --   --     Chemistries   Recent Labs Lab 06/27/15 1835 06/29/15 0406 06/30/15 0555 07/01/15 0559  NA 139 138 140  --   K 4.7 3.9 3.3* 4.2  CL 104 102 104  --   CO2 --   GLUCOSE 116* 120* 92  --   BUN --   CREATININE 0.54 0.59 0.54  --   CALCIUM 8.4* 8.2* 8.1*  --    ------------------------------------------------------------------------------------------------------------------ No results for input(s): CHOL, HDL, LDLCALC, TRIG, CHOLHDL, LDLDIRECT in the last 72 hours.  No results found for: HGBA1C ------------------------------------------------------------------------------------------------------------------  Recent Labs  06/29/15 1051  TSH 1.271   ------------------------------------------------------------------------------------------------------------------ No results for input(s): VITAMINB12, FOLATE, FERRITIN, TIBC, IRON, RETICCTPCT in the last 72 hours.  Coagulation profile  Recent Labs Lab 06/28/15 1028  INR 1.27    No results for input(s): DDIMER in the last 72 hours.  Cardiac Enzymes No results for input(s): CKMB, TROPONINI, MYOGLOBIN in the last 168 hours.  Invalid input(s): CK ------------------------------------------------------------------------------------------------------------------ No results found for: BNP  Time Spent in minutes   35   Susa Raring K M.D on 07/01/2015 at 11:17 AM  Between 7am to 7pm - Pager - [phone]  After 7pm go to [email or web address] - password  [facility]  [facility] -  Office  [phone]

## 2015-07-01 NOTE — Progress Notes (Signed)
Subjective: 3 Days Post-Op Procedure(s) (LRB): INTRAMEDULLARY (IM) NAIL LEFT HIP (Left) Patient resting comfortably in room with daughter at bedside.    Objective: Vital signs in last 24 hours: Temp:  [97.5 F (36.4 C)-97.8 F (36.6 C)] 97.8 F (36.6 C) (03/12 0700) Pulse Rate:  [92-108] 95 (03/12 0903) Resp:  [16-18] 18 (03/12 0700) BP: (99-122)/(41-62) 99/41 mmHg (03/12 0903) SpO2:  [90 %-92 %] 92 % (03/12 0700)  Intake/Output from previous day: 03/11 0701 - 03/12 0700 In: 480 [P.O.:480] Out: 350 [Urine:350] Intake/Output this shift:     Recent Labs  06/29/15 0446 06/30/15 0555  HGB 13.3 11.3*    Recent Labs  06/29/15 0446 06/30/15 0555  WBC 17.0* 14.9*  RBC 4.77 4.14  HCT 42.5 36.6  PLT 496* 412*    Recent Labs  06/29/15 0406 06/30/15 0555 07/01/15 0559  NA 138 140  --   K 3.9 3.3* 4.2  CL 102 104  --   CO2 25 29  --   BUN 7 10  --   CREATININE 0.59 0.54  --   GLUCOSE 120* 92  --   CALCIUM 8.2* 8.1*  --    No results for input(s): LABPT, INR in the last 72 hours.  Incision: dressing C/D/I  Assessment/Plan: 3 Days Post-Op Procedure(s) (LRB): INTRAMEDULLARY (IM) NAIL LEFT HIP (Left) Up with therapy  wbat LLE dispo per med team but per SW note will be at least Mon. ASA dvt proph  Stacy Little 07/01/2015, 11:25 AM

## 2015-07-01 NOTE — Progress Notes (Signed)
Message received from Pt's family stating that they toured [facility] and that they would like to explore this facility.  They are asking that [facility] CSW contact [facility] to see if they will offer a bed.  Providence CrosbyAmanda Darnette Lampron, LCSW Clinical Social Work [phone]

## 2015-07-01 NOTE — Progress Notes (Signed)
Spoke with Pt's daughter.   Pt's daughter stated that [facility] have [facility] and learned that the facility will accept outsiders.  She stated that [facility] is her first option, with [facility] her second.  Pt's daughter asked that the [facility] CSW call her at [phone].  Providence CrosbyAmanda Cloria Ciresi, LCSW Clinical Social Work [phone]

## 2015-07-02 NOTE — Progress Notes (Signed)
Physical Therapy Treatment Patient Details Name: Stacy Little MRN: [id number] DOB: [birth year] Today's Date: 07/02/2015    History of Present Illness 82 y.o. year old who fell and suffered a hip fracture, now s/p IM nailing.    PT Comments    Patient is making gradual progress with mobility but continues to be limited by pain. Explained benefits of weightbearing on affected LE and encouraged pt to attempt further ambulation during session. Continue to progress as tolerated with anticipated d/c to SNF for further skilled PT services.    Follow Up Recommendations  SNF;Supervision/Assistance - 24 hour     Equipment Recommendations  Other (comment) (TBD next venue of care)    Recommendations for Other Services       Precautions / Restrictions Precautions Precautions: Fall Restrictions Weight Bearing Restrictions: Yes LLE Weight Bearing: Weight bearing as tolerated    Mobility  Bed Mobility               General bed mobility comments: Pt OOB in chair upon arrival  Transfers Overall transfer level: Needs assistance Equipment used: Rolling walker (2 wheeled) Transfers: Sit to/from [employer];Stand Pivot Transfers Sit to Stand: Mod assist Stand pivot transfers: Mod assist       General transfer comment: X 2 from recliner chair and X 1 from [facility]; assist to power up into standing and to maintain balance; pt with posterior lean; verbal and tactile cues for posture, hand/foot placement, sequencing, and position of RW  Ambulation/Gait Ambulation/Gait assistance: Mod assist Ambulation Distance (Feet): 8 Feet (4X2) Assistive device: Rolling walker (2 wheeled) Gait Pattern/deviations: Step-to pattern;Decreased weight shift to left;Decreased stance time - left;Decreased step length - right;Narrow base of support;Trunk flexed Gait velocity: decreased   General Gait Details: multimodal cues for posture, position of RW, and sequencing; pt with difficulty weightbearing on L LE and  repeated that her L LE "hurts"; assist for maintaining balance and guidance of RW and positioning of feet   Stairs            Wheelchair Mobility    Modified Rankin (Stroke Patients Only)       Balance                                    Cognition Arousal/Alertness: Awake/alert Behavior During Therapy: WFL for tasks assessed/performed Overall Cognitive Status: History of cognitive impairments - at baseline                      Exercises      General Comments        Pertinent Vitals/Pain Pain Assessment: Faces Faces Pain Scale: Hurts little more Pain Location: L hip with mobility Pain Descriptors / Indicators: Grimacing;Guarding;Sore Pain Intervention(s): Limited activity within patient's tolerance;Monitored during session;Premedicated before session;Repositioned    Home Living                      Prior Function            PT Goals (current goals can now be found in the care plan section) Acute Rehab PT Goals PT Goal Formulation: With patient/family Time For Goal Achievement: 07/13/15 Potential to Achieve Goals: Good Progress towards PT goals: Progressing toward goals    Frequency  Min 2X/week    PT Plan Current plan remains appropriate    Co-evaluation             End  of Session Equipment Utilized During Treatment: Gait belt Activity Tolerance: Patient limited by pain Patient left: in chair;with call bell/phone within reach;with family/visitor present     Time: 4098-1191 PT Time Calculation (min) (ACUTE ONLY): 30 min  Charges:  $Gait Training: 8-22 mins $Therapeutic Activity: 8-22 mins                    G Codes:      Derek Mound, PTA Pager: [phone]   07/02/2015, 11:29 AM

## 2015-07-02 NOTE — Clinical Social Work Placement (Signed)
   CLINICAL SOCIAL WORK PLACEMENT  NOTE  Date:  07/02/2015  Patient Details  Name: Phill Myronnn Schuur MRN: [id number] Date of Birth: 1915-12-27  Clinical Social Work is seeking post-discharge placement for this patient at the Skilled  Nursing Facility level of care (*CSW will initial, date and re-position this form in  chart as items are completed):  Yes   Patient/family provided with [facility] Clinical Social Work Department's list of facilities offering this level of care within the geographic area requested by the patient (or if unable, by the patient's family).  Yes   Patient/family informed of their freedom to choose among providers that offer the needed level of care, that participate in Medicare, Medicaid or managed care program needed by the patient, have an available bed and are willing to accept the patient.  Yes   Patient/family informed of [facility]'s ownership interest in [facility] and [facility], as well as of the fact that they are under no obligation to receive care at these facilities.  PASRR submitted to EDS on 06/30/15     PASRR number received on 07/02/15     Existing PASRR number confirmed on       FL2 transmitted to all facilities in geographic area requested by pt/family on 06/30/15     FL2 transmitted to all facilities within larger geographic area on       Patient informed that his/her managed care company has contracts with or will negotiate with certain facilities, including the following:        Yes   Patient/family informed of bed offers received.  [facility]     Physician recommends and patient chooses bed at      [facility].  Patient to be transferred to facility by Ambulance     Patient family notified on 07/02/15 of transfer.  Name of family member notified:  Patient daughter and son in law at bedside     PHYSICIAN Please sign FL2, Please sign DNR      Additional Comment:    Macario GoldsJesse Jamal Pavon, LCSW [phone]

## 2015-07-02 NOTE — Clinical Social Work Note (Signed)
Clinical Social Worker facilitated patient discharge including contacting patient family and facility to confirm patient discharge plans.  Clinical information faxed to facility and family agreeable with plan.  CSW arranged ambulance transport via [facility] to [facility].  RN to call report prior to discharge.  Clinical Social Worker will sign off for now as social work intervention is no longer needed. Please consult us again if new need arises.  Jesse Dylan Ruotolo, LCSW [phone] 

## 2015-07-02 NOTE — Care Management Important Message (Signed)
Important Message  Patient Details  Name: Stacy Little MRN: [id number] Date of Birth: 09/21/1915   Medicare Important Message Given:  Yes    Tauni Sanks P Marck Mcclenny 07/02/2015, 12:29 PM

## 2015-07-02 NOTE — Progress Notes (Signed)
[facility]. All questions/concerns addressed. Will continue to monitor

## 2015-07-03 ENCOUNTER — Telehealth: Payer: Self-pay | Admitting: *Deleted

## 2015-07-03 NOTE — Telephone Encounter (Signed)
Joy with [facility] called and requested patients immunization record to be faxed to her at Fax #: [phone]. Faxed.

## 2015-07-04 ENCOUNTER — Encounter (HOSPITAL_COMMUNITY): Payer: Self-pay | Admitting: Orthopedic Surgery

## 2015-07-04 ENCOUNTER — Ambulatory Visit: Payer: Medicare Other | Admitting: Internal Medicine

## 2016-01-01 ENCOUNTER — Telehealth: Payer: Self-pay | Admitting: *Deleted

## 2016-01-01 NOTE — Telephone Encounter (Signed)
Received fax from [facility] #[phone] for Oxygen. Filled out the Certificate of Medical Necessity and printed last OV note and face sheet and gave to Dr. Montez Moritaarter to review and sign. To be faxed back to Fax: [phone]

## 2016-08-25 ENCOUNTER — Telehealth: Payer: Self-pay | Admitting: Internal Medicine

## 2016-08-25 NOTE — Telephone Encounter (Signed)
I left a message with patient's daughter asking for an update on her mom who hasn't been since in office since Feb. 2017. Per notes[facility]. VDM (DD)

## 2016-08-28 ENCOUNTER — Telehealth: Payer: Self-pay

## 2016-08-28 NOTE — Telephone Encounter (Signed)
Patients daughter Dolphus JennyKaren Robbins called to inform the office that patient is deceased her mother passed on 12/11/2015.

## 2016-08-28 NOTE — Telephone Encounter (Signed)
Per Forest BeckerBetty Little daughter Clydie BraunKaren called back and she stated that [place] and she do not have a death certificate or a obituary to verify death of patient.

## 2016-08-28 NOTE — Telephone Encounter (Signed)
Left message for patients daughter to return call to office.  Reason for call: To get verification of mothers death. Would need death certificate or obituary.

## 2017-07-17 NOTE — Progress Notes (Signed)
Patient ID: Stacy Little, female   DOB: [birth year], 84 y.o.   MRN: [id number] Ambulatory pulse ox completed to determine need for O2

## 2017-12-19 IMAGING — CT CT CHEST W/O CM
3 of 4 series · 16 of 30 positions shown, 18 images · non-contrast
Comparison: None.

CLINICAL DATA: Hypoxia. Remote history of ovarian cancer, status
post TAH/ BSO, status post oral chemotherapy.

EXAM:
CT CHEST WITHOUT CONTRAST
TECHNIQUE: Multidetector CT imaging of the chest was performed following the
standard protocol without IV contrast.

[Series 3: chest w/o · axial · non-contrast · 0.56mm/px · z∈[-286,-66]mm · 5 of 67 slices shown]
[im 12/67  lung]
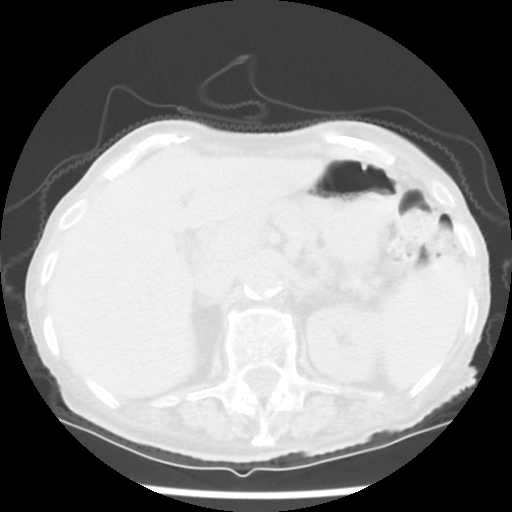
[im 23/67  lung]
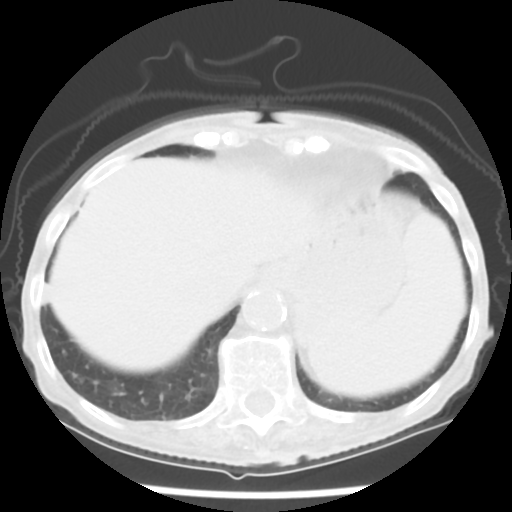
[im 34/67  lung]
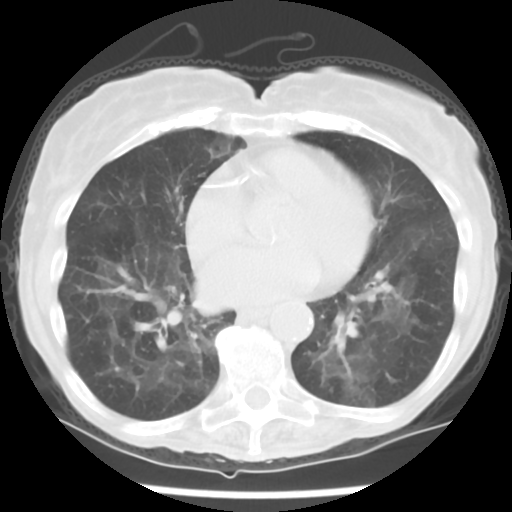
[im 45/67  lung]
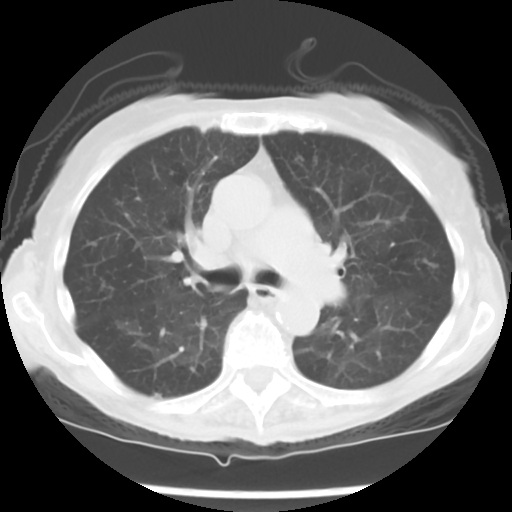
[im 56/67  lung]
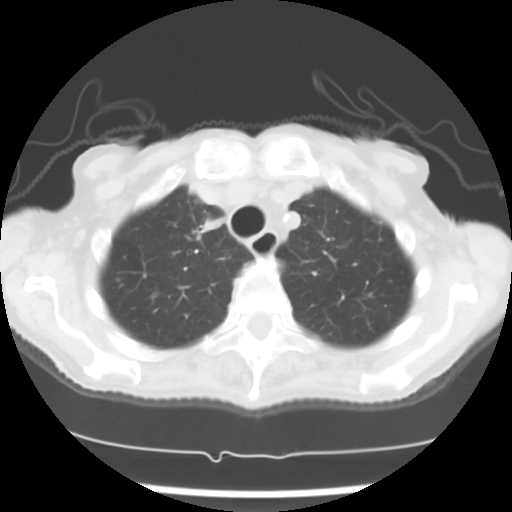

[Series 4: lung windows · axial · 0.56mm/px · z∈[-296,-61]mm · 6 of 67 slices shown, 8 images]
[im 10/67  mediastinal]
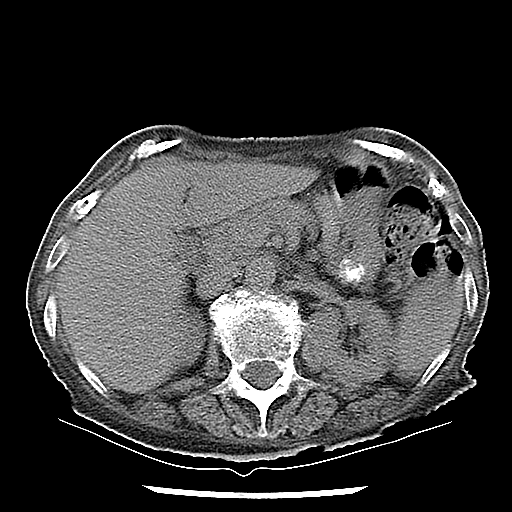
[im 10/67  lung]
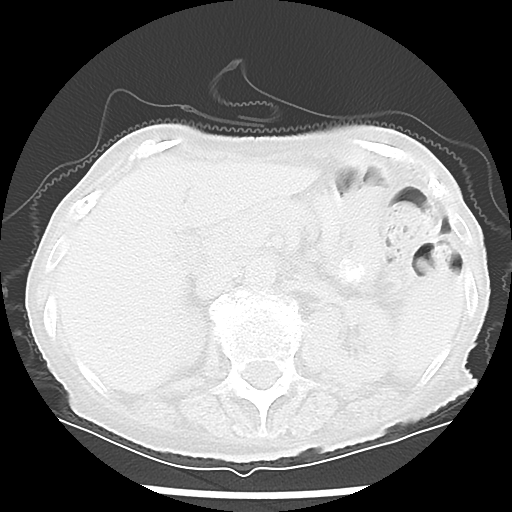
[im 19/67  lung]
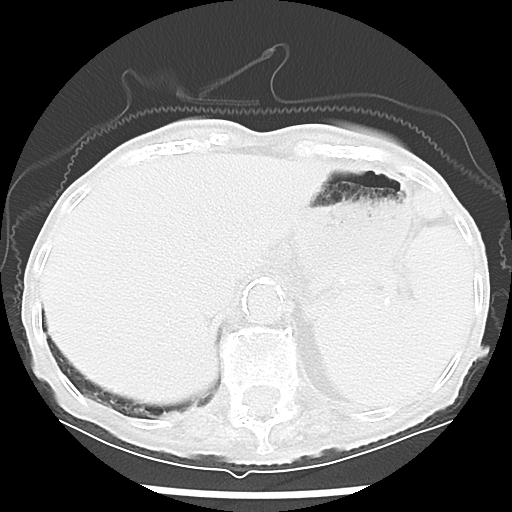
[im 29/67  lung]
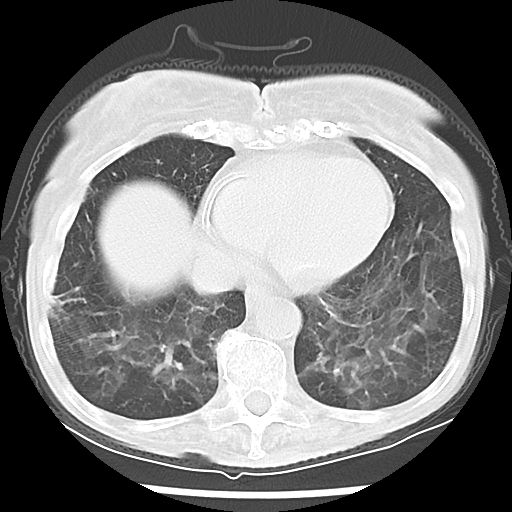
[im 38/67  lung]
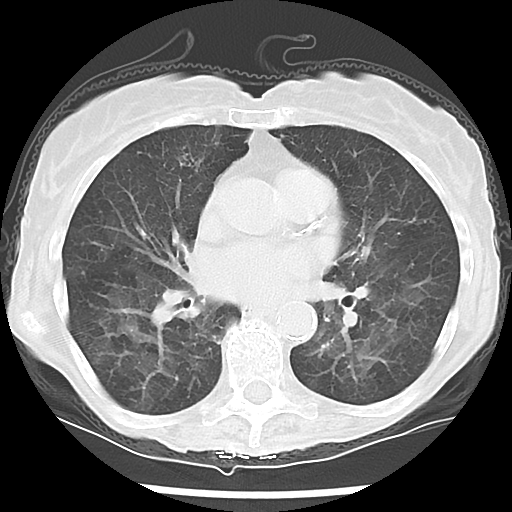
[im 48/67  mediastinal]
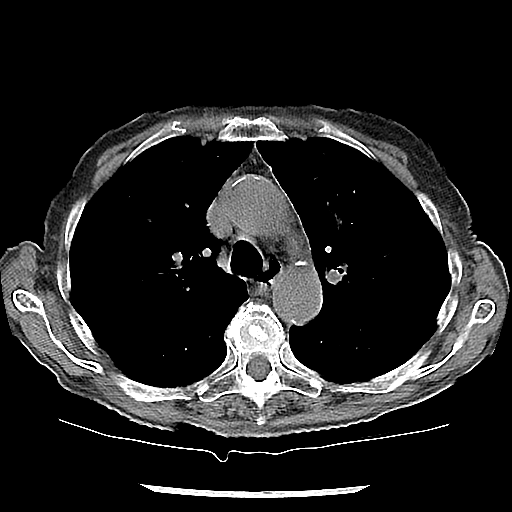
[im 48/67  lung]
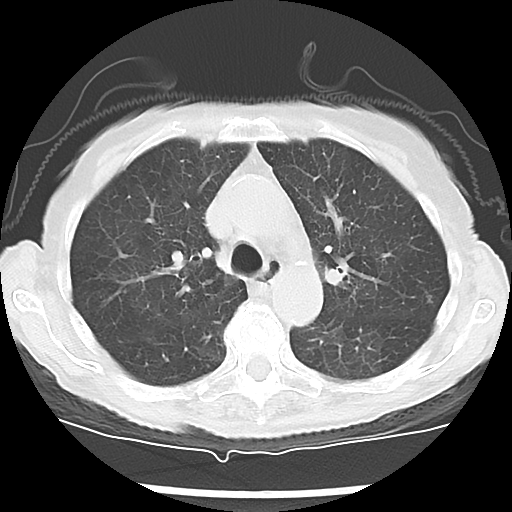
[im 57/67  lung]
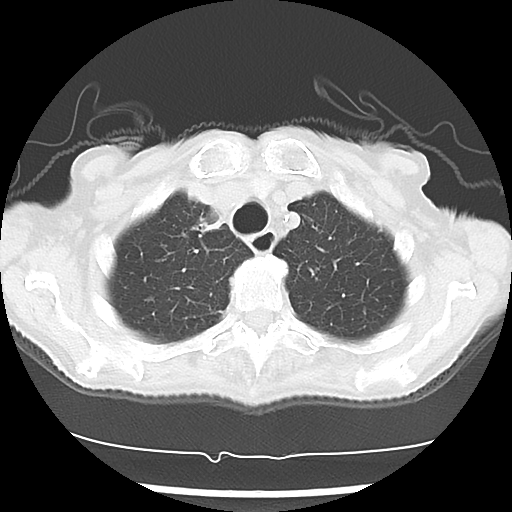

[Series 602: sagittal body · sagittal · 0.65mm/px · 5 of 115 slices shown]
[im 10/115  mediastinal]
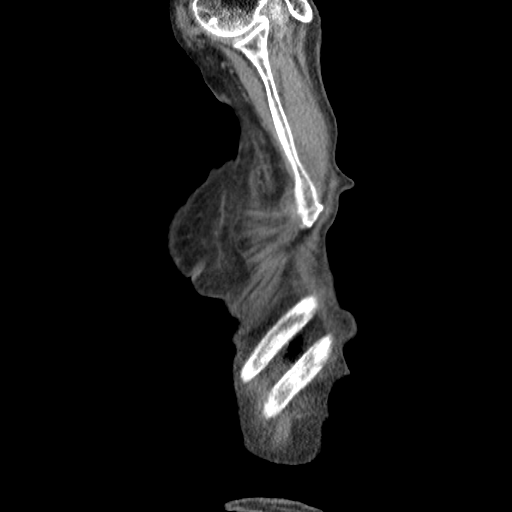
[im 29/115  mediastinal]
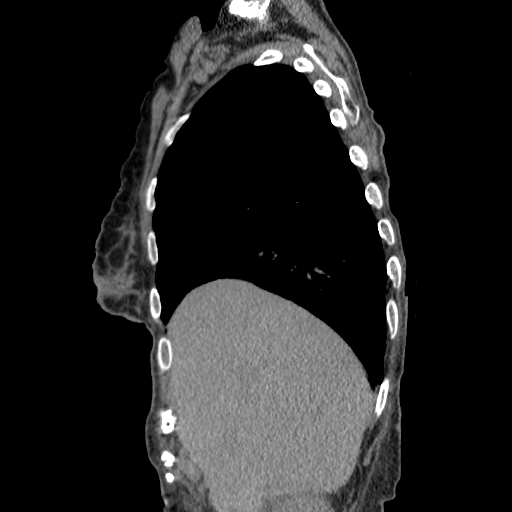
[im 39/115  mediastinal]
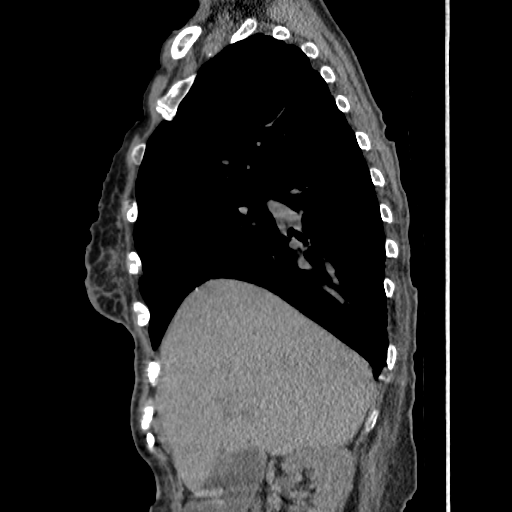
[im 48/115  mediastinal]
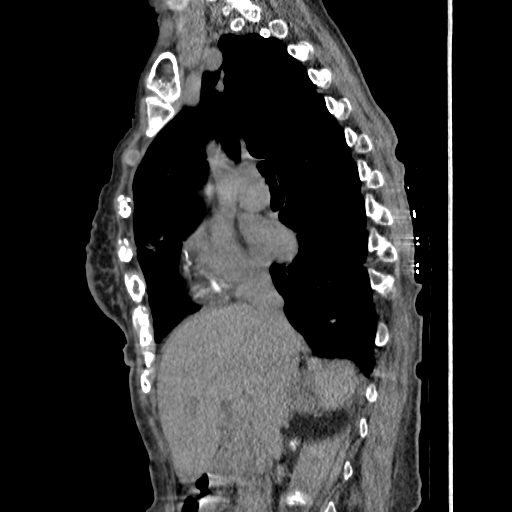
[im 67/115  mediastinal]
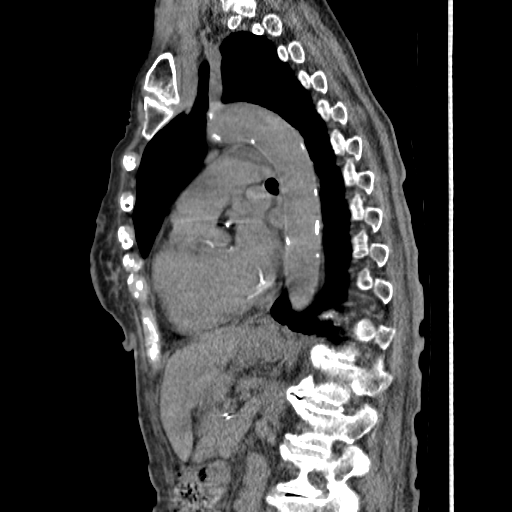

[16 of 30 positions shown; findings below may reference images not displayed]

FINDINGS: Mediastinum/Nodes: Heart is normal in size. No pericardial effusion.

Three vessel coronary atherosclerosis.

Atherosclerotic calcifications of the aortic arch. Mild ectasia of
the ascending thoracic aorta, measuring 3.2 cm.

Small mediastinal lymph nodes which do not meet pathologic CT size
criteria.

Visualized thyroid is unremarkable.

Lungs/Pleura: Scattered subpleural patchy opacities, for example in
the medial right upper lobe (series 4/image 12) and the lateral
right lung base (series 4/ image 42), with associated
bronchiectasis, favored to reflect scarring related to prior
infection.

Central ground-glass opacity/mosaic attenuation, bilateral lower
lobe predominant (series 4/ image 32), suggesting mild interstitial
lung disease, nonspecific.

4 mm subpleural nodule in the right lower lobe (series 4/ image 31).
Additional tiny nodules bilaterally measuring up to 3-4 mm.

Suspected mild centrilobular emphysematous changes.

No pleural effusion or pneumothorax.

Upper abdomen: Visualized upper abdomen is notable for vascular
calcifications.

Musculoskeletal: Degenerative changes of the visualized
thoracolumbar spine.
IMPRESSION: Subpleural patchy opacities in the right lung, favored to reflect
scarring related to prior infection.

Central ground-glass opacity/mosaic attenuation, lower lobe
predominant, suggesting mild interstitial lung disease, possibly
chronic. While nonspecific, NSIP/drug toxicity related to prior
chemotherapy is possible.

Scattered small bilateral pulmonary nodules measuring up to 4 mm in
the right lower lobe, nonspecific. Given the remote history of the
patient's cancer, this is considered unrelated. As such, assuming
patient is high risk for primary bronchogenic neoplasm, a single
follow-up CT chest is suggested in 1 year.

This recommendation follows the consensus statement: Guidelines for
Management of Small Pulmonary Nodules Detected on CT Scans: A
Statement from the [HOSPITAL] as published in Radiology

## 2017-12-19 IMAGING — CT CT HEAD W/O CM
2 series · 15 of 30 positions shown, 19 images · non-contrast
Comparison: None.

CLINICAL DATA: Memory loss, confusion, remote history of ovarian
cancer

EXAM:
CT HEAD WITHOUT CONTRAST
TECHNIQUE: Contiguous axial images were obtained from the base of the skull
through the vertex without intravenous contrast.

[Series 3: head bone · axial · 0.49mm/px · z∈[+11,+31]mm · 2 of 28 slices shown]
[im 2/28  bone]
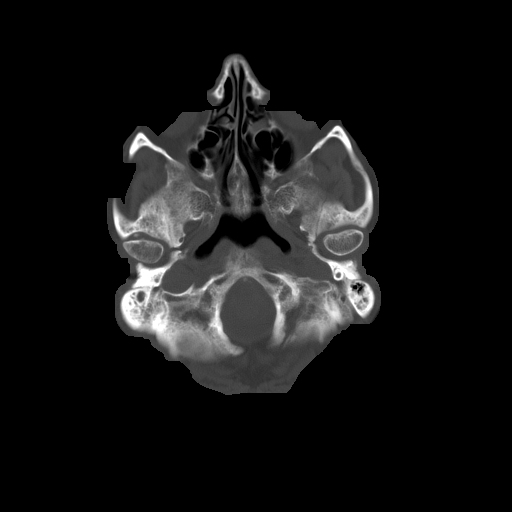
[im 6/28  bone]
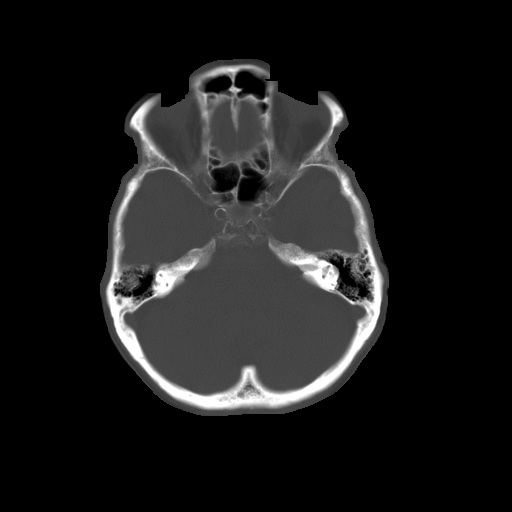

[Series 32: 3d filtered head w/o · axial · non-contrast · 0.49mm/px · z∈[+11,+132]mm · 13 of 28 slices shown, 17 images]
[im 2/28  brain]
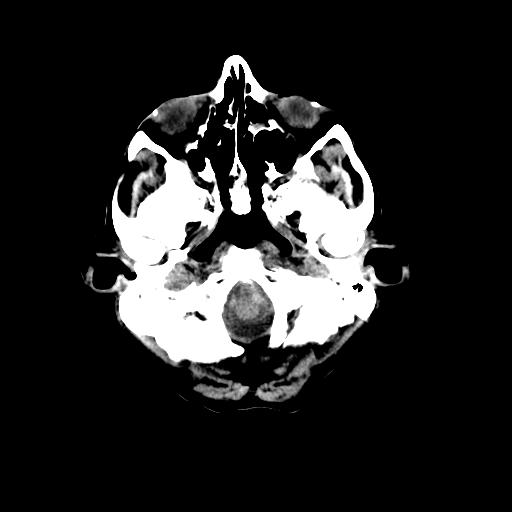
[im 2/28  bone]
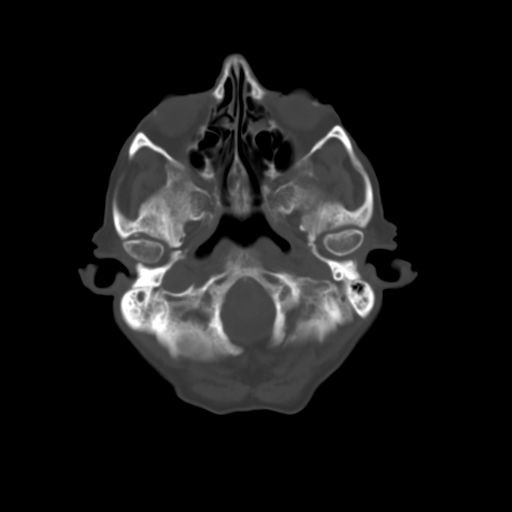
[im 4/28  brain]
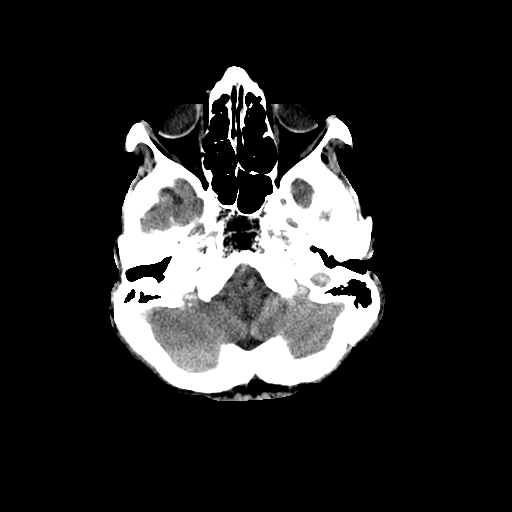
[im 6/28  brain]
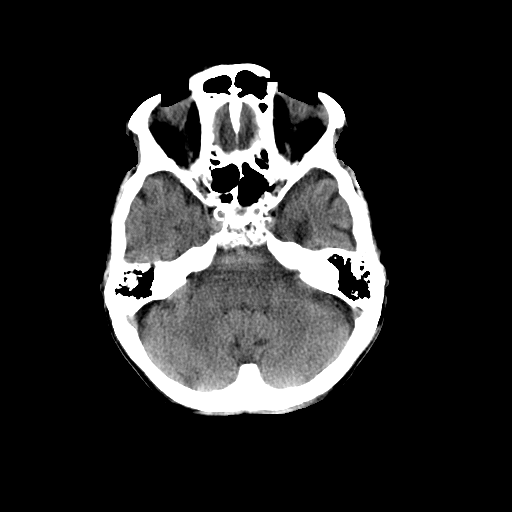
[im 8/28  brain]
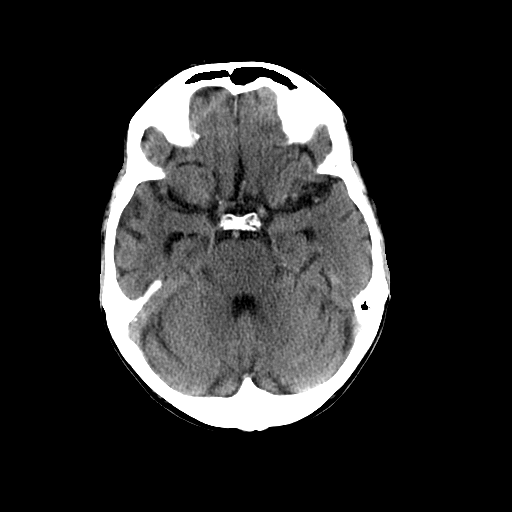
[im 10/28  brain]
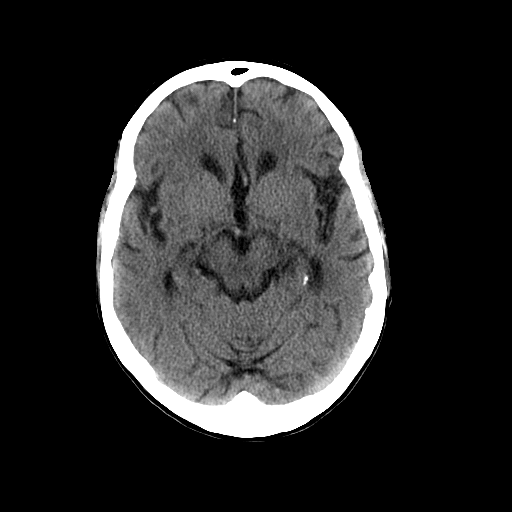
[im 10/28  bone]
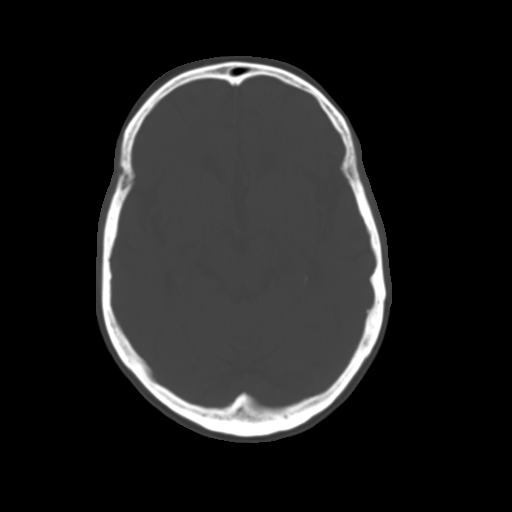
[im 12/28  brain]
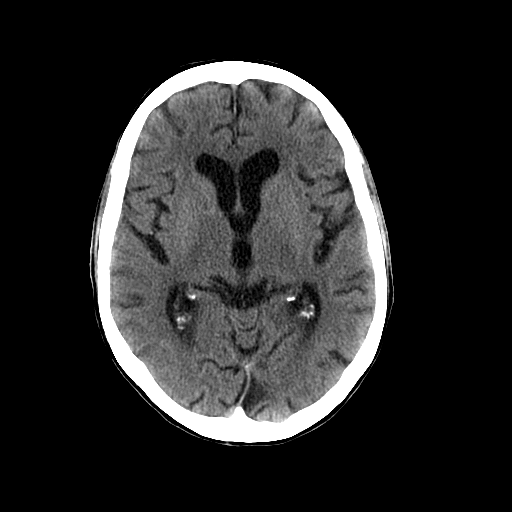
[im 14/28  brain]
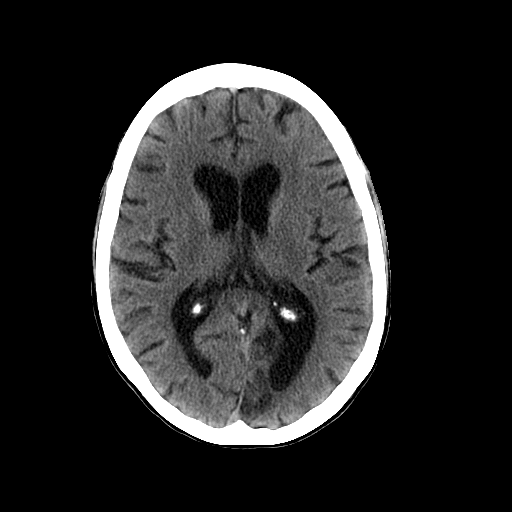
[im 16/28  brain]
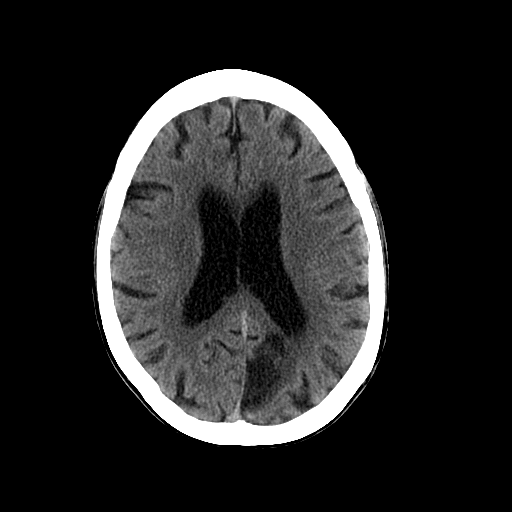
[im 18/28  brain]
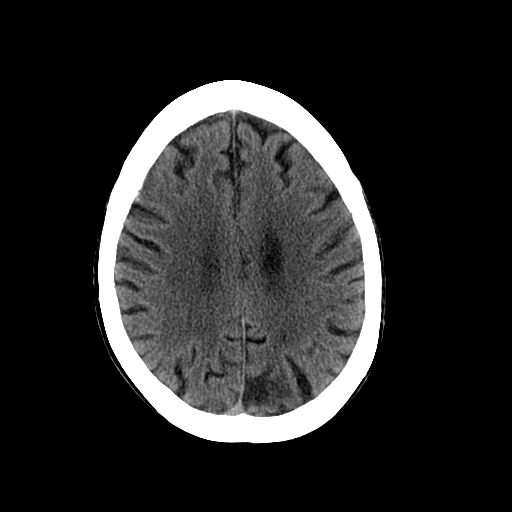
[im 18/28  bone]
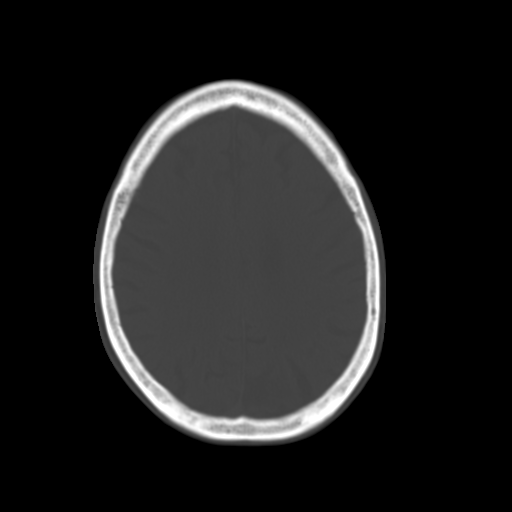
[im 20/28  brain]
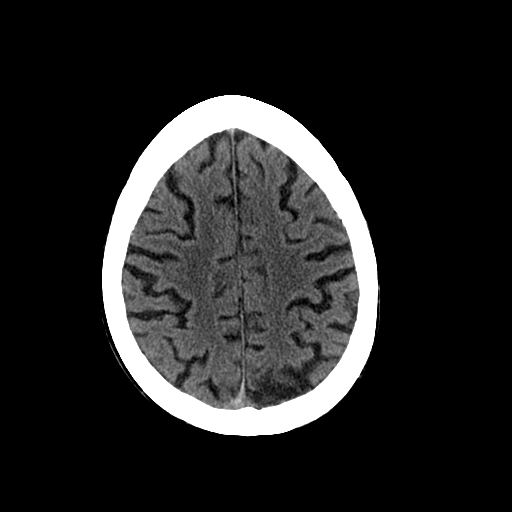
[im 22/28  brain]
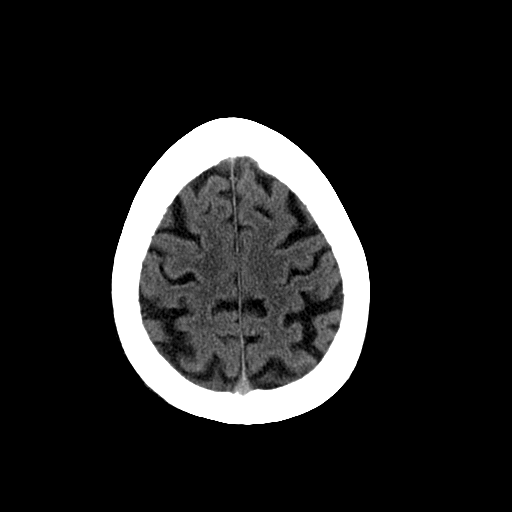
[im 24/28  brain]
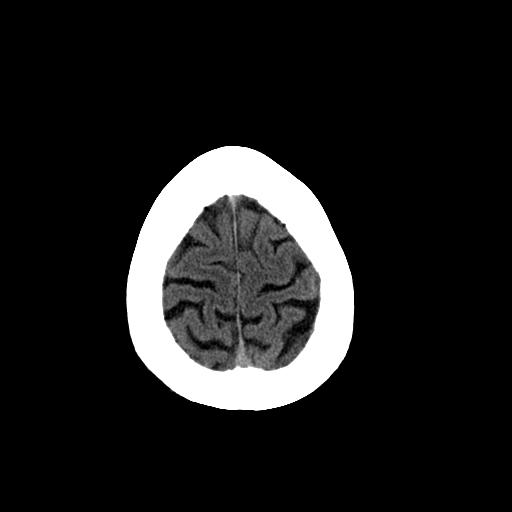
[im 26/28  brain]
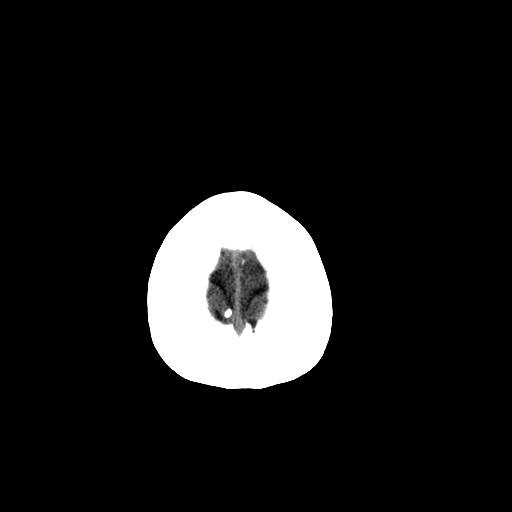
[im 26/28  bone]
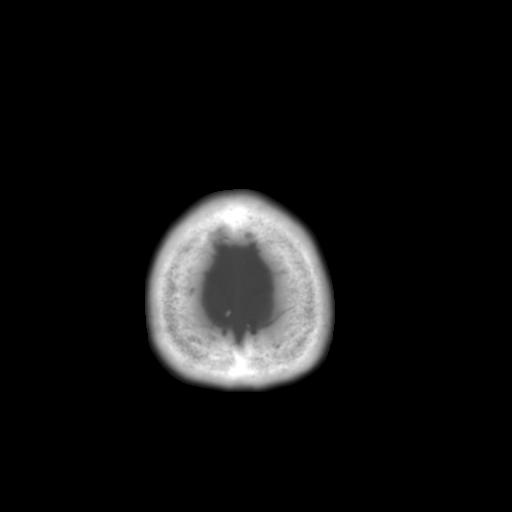

[15 of 30 positions shown; findings below may reference images not displayed]

FINDINGS: No evidence of parenchymal hemorrhage or extra-axial fluid
collection. No mass lesion, mass effect, or midline shift.

No CT evidence of acute infarction.

Encephalomalacic changes related to old left PCA distribution
infarct in the medial left occipital lobe.

Mild subcortical white matter and periventricular small vessel
ischemic changes. Intracranial atherosclerosis.

Cerebral volume is within normal limits.  No ventriculomegaly.

Partial opacification of the right ethmoid sinuses. Visualized
paranasal sinuses and mastoid air cells otherwise clear.

No evidence of calvarial fracture.
IMPRESSION: No evidence of acute intracranial abnormality.

Old left PCA distribution infarct.

Mild small vessel ischemic changes.
